# Patient Record
Sex: Male | Born: 1955 | Race: White | Hispanic: No | Smoking: Former smoker
Health system: Southern US, Community
[De-identification: ages and names within clinical notes are randomized; demographics above are authoritative.]

## PROBLEM LIST (undated history)

## (undated) DIAGNOSIS — M545 Low back pain, unspecified: Secondary | ICD-10-CM

## (undated) DIAGNOSIS — E785 Hyperlipidemia, unspecified: Secondary | ICD-10-CM

## (undated) DIAGNOSIS — F32A Depression, unspecified: Secondary | ICD-10-CM

## (undated) DIAGNOSIS — F4310 Post-traumatic stress disorder, unspecified: Secondary | ICD-10-CM

## (undated) DIAGNOSIS — B001 Herpesviral vesicular dermatitis: Secondary | ICD-10-CM

## (undated) DIAGNOSIS — G8929 Other chronic pain: Secondary | ICD-10-CM

## (undated) DIAGNOSIS — K5790 Diverticulosis of intestine, part unspecified, without perforation or abscess without bleeding: Secondary | ICD-10-CM

## (undated) DIAGNOSIS — F329 Major depressive disorder, single episode, unspecified: Secondary | ICD-10-CM

## (undated) DIAGNOSIS — F419 Anxiety disorder, unspecified: Secondary | ICD-10-CM

## (undated) HISTORY — PX: UMBILICAL HERNIA REPAIR: SHX196

## (undated) HISTORY — PX: BACK SURGERY: SHX140

## (undated) HISTORY — PX: LUMBAR SPINE SURGERY: SHX701

## (undated) HISTORY — DX: Anxiety disorder, unspecified: F41.9

## (undated) HISTORY — DX: Depression, unspecified: F32.A

## (undated) HISTORY — DX: Post-traumatic stress disorder, unspecified: F43.10

## (undated) HISTORY — DX: Hyperlipidemia, unspecified: E78.5

## (undated) HISTORY — DX: Herpesviral vesicular dermatitis: B00.1

## (undated) HISTORY — DX: Low back pain, unspecified: M54.50

## (undated) HISTORY — DX: Major depressive disorder, single episode, unspecified: F32.9

## (undated) HISTORY — DX: Diverticulosis of intestine, part unspecified, without perforation or abscess without bleeding: K57.90

## (undated) HISTORY — DX: Low back pain: M54.5

## (undated) HISTORY — DX: Other chronic pain: G89.29

## (undated) HISTORY — PX: COLON RESECTION: SHX5231

---

## 1997-10-27 ENCOUNTER — Ambulatory Visit (HOSPITAL_BASED_OUTPATIENT_CLINIC_OR_DEPARTMENT_OTHER): Admission: RE | Admit: 1997-10-27 | Discharge: 1997-10-27 | Payer: Self-pay | Admitting: Cardiology

## 2001-05-13 ENCOUNTER — Encounter: Payer: Self-pay | Admitting: Pediatrics

## 2001-05-13 ENCOUNTER — Ambulatory Visit (HOSPITAL_COMMUNITY): Admission: RE | Admit: 2001-05-13 | Discharge: 2001-05-13 | Payer: Self-pay | Admitting: Pediatrics

## 2001-11-17 ENCOUNTER — Encounter: Payer: Self-pay | Admitting: Occupational Medicine

## 2001-11-17 ENCOUNTER — Encounter: Admission: RE | Admit: 2001-11-17 | Discharge: 2001-11-17 | Payer: Self-pay | Admitting: Occupational Medicine

## 2002-01-07 ENCOUNTER — Encounter: Payer: Self-pay | Admitting: Emergency Medicine

## 2002-01-07 ENCOUNTER — Inpatient Hospital Stay (HOSPITAL_COMMUNITY): Admission: EM | Admit: 2002-01-07 | Discharge: 2002-01-10 | Payer: Self-pay | Admitting: Emergency Medicine

## 2002-02-17 ENCOUNTER — Ambulatory Visit (HOSPITAL_COMMUNITY): Admission: RE | Admit: 2002-02-17 | Discharge: 2002-02-17 | Payer: Self-pay | Admitting: General Surgery

## 2002-05-07 ENCOUNTER — Inpatient Hospital Stay (HOSPITAL_COMMUNITY): Admission: EM | Admit: 2002-05-07 | Discharge: 2002-05-14 | Payer: Self-pay | Admitting: Internal Medicine

## 2002-05-07 ENCOUNTER — Encounter: Payer: Self-pay | Admitting: Internal Medicine

## 2002-05-08 ENCOUNTER — Encounter: Payer: Self-pay | Admitting: Internal Medicine

## 2003-01-26 ENCOUNTER — Emergency Department (HOSPITAL_COMMUNITY): Admission: EM | Admit: 2003-01-26 | Discharge: 2003-01-27 | Payer: Self-pay | Admitting: Emergency Medicine

## 2003-01-31 ENCOUNTER — Emergency Department (HOSPITAL_COMMUNITY): Admission: EM | Admit: 2003-01-31 | Discharge: 2003-01-31 | Payer: Self-pay | Admitting: Internal Medicine

## 2005-08-24 ENCOUNTER — Emergency Department (HOSPITAL_COMMUNITY): Admission: EM | Admit: 2005-08-24 | Discharge: 2005-08-24 | Payer: Self-pay | Admitting: Emergency Medicine

## 2007-03-22 ENCOUNTER — Emergency Department (HOSPITAL_COMMUNITY): Admission: EM | Admit: 2007-03-22 | Discharge: 2007-03-22 | Payer: Self-pay | Admitting: Family Medicine

## 2009-11-20 ENCOUNTER — Emergency Department (HOSPITAL_COMMUNITY)
Admission: EM | Admit: 2009-11-20 | Discharge: 2009-11-20 | Payer: Self-pay | Source: Home / Self Care | Admitting: Emergency Medicine

## 2009-11-27 ENCOUNTER — Encounter: Admission: RE | Admit: 2009-11-27 | Discharge: 2009-11-27 | Payer: Self-pay | Admitting: Family Medicine

## 2010-05-10 ENCOUNTER — Emergency Department (HOSPITAL_COMMUNITY)
Admission: EM | Admit: 2010-05-10 | Discharge: 2010-05-10 | Payer: Self-pay | Source: Home / Self Care | Admitting: Emergency Medicine

## 2010-05-17 ENCOUNTER — Emergency Department (HOSPITAL_COMMUNITY): Admission: EM | Admit: 2010-05-17 | Discharge: 2010-05-17 | Payer: Self-pay | Admitting: Emergency Medicine

## 2010-05-22 ENCOUNTER — Emergency Department (HOSPITAL_COMMUNITY): Admission: EM | Admit: 2010-05-22 | Discharge: 2010-05-22 | Payer: Self-pay | Admitting: Emergency Medicine

## 2010-05-31 ENCOUNTER — Emergency Department (HOSPITAL_COMMUNITY)
Admission: EM | Admit: 2010-05-31 | Discharge: 2010-05-31 | Payer: Self-pay | Source: Home / Self Care | Admitting: Emergency Medicine

## 2010-06-07 ENCOUNTER — Emergency Department (HOSPITAL_COMMUNITY)
Admission: EM | Admit: 2010-06-07 | Discharge: 2010-06-07 | Payer: Self-pay | Source: Home / Self Care | Admitting: Emergency Medicine

## 2010-06-14 ENCOUNTER — Emergency Department (HOSPITAL_COMMUNITY)
Admission: EM | Admit: 2010-06-14 | Discharge: 2010-06-14 | Payer: Self-pay | Source: Home / Self Care | Admitting: Emergency Medicine

## 2010-07-21 ENCOUNTER — Encounter: Payer: Self-pay | Admitting: Family Medicine

## 2010-08-05 ENCOUNTER — Emergency Department (HOSPITAL_COMMUNITY)
Admission: EM | Admit: 2010-08-05 | Discharge: 2010-08-05 | Disposition: A | Discharge status: Home / Self Care | Payer: Worker's Compensation | Attending: Emergency Medicine | Admitting: Emergency Medicine

## 2010-08-05 DIAGNOSIS — F4310 Post-traumatic stress disorder, unspecified: Secondary | ICD-10-CM | POA: Insufficient documentation

## 2010-08-05 DIAGNOSIS — Z79899 Other long term (current) drug therapy: Secondary | ICD-10-CM | POA: Insufficient documentation

## 2010-08-05 DIAGNOSIS — M549 Dorsalgia, unspecified: Secondary | ICD-10-CM | POA: Insufficient documentation

## 2010-08-05 DIAGNOSIS — F3289 Other specified depressive episodes: Secondary | ICD-10-CM | POA: Insufficient documentation

## 2010-08-05 DIAGNOSIS — F329 Major depressive disorder, single episode, unspecified: Secondary | ICD-10-CM | POA: Insufficient documentation

## 2010-08-05 DIAGNOSIS — G8929 Other chronic pain: Secondary | ICD-10-CM | POA: Insufficient documentation

## 2010-08-05 LAB — DIFFERENTIAL
Basophils Absolute: 0.1 10*3/uL (ref 0.0–0.1)
Basophils Relative: 1 % (ref 0–1)
Eosinophils Absolute: 0.1 10*3/uL (ref 0.0–0.7)
Lymphocytes Relative: 22 % (ref 12–46)
Neutro Abs: 6.2 10*3/uL (ref 1.7–7.7)

## 2010-08-05 LAB — CBC
Hemoglobin: 14.7 g/dL (ref 13.0–17.0)
MCH: 28.1 pg (ref 26.0–34.0)

## 2010-08-05 LAB — BASIC METABOLIC PANEL
Calcium: 9.9 mg/dL (ref 8.4–10.5)
GFR calc Af Amer: >60 mL/min (ref >60)
GFR calc non Af Amer: >60 mL/min (ref >60)
Potassium: 3.9 mEq/L (ref 3.5–5.1)
Sodium: 141 mEq/L (ref 135–145)

## 2010-08-05 LAB — URINALYSIS, ROUTINE W REFLEX MICROSCOPIC
Hgb urine dipstick: NEGATIVE
Nitrite: NEGATIVE
Protein, ur: NEGATIVE mg/dL
Specific Gravity, Urine: >1.03 — ABNORMAL HIGH (ref 1.005–1.030)
Urine Glucose, Fasting: NEGATIVE mg/dL
Urobilinogen, UA: 0.2 mg/dL (ref 0.0–1.0)
pH: 5.5 (ref 5.0–8.0)

## 2010-08-05 LAB — RAPID URINE DRUG SCREEN, HOSP PERFORMED: Cocaine: NOT DETECTED

## 2010-08-05 LAB — ETHANOL: Alcohol, Ethyl (B): <5 mg/dL (ref 0–10)

## 2010-08-08 ENCOUNTER — Emergency Department (HOSPITAL_COMMUNITY)
Admission: EM | Admit: 2010-08-08 | Discharge: 2010-08-08 | Disposition: A | Discharge status: Home / Self Care | Payer: Worker's Compensation | Attending: Emergency Medicine | Admitting: Emergency Medicine

## 2010-08-08 DIAGNOSIS — G8929 Other chronic pain: Secondary | ICD-10-CM | POA: Insufficient documentation

## 2010-08-08 DIAGNOSIS — M549 Dorsalgia, unspecified: Secondary | ICD-10-CM | POA: Insufficient documentation

## 2010-08-08 DIAGNOSIS — E785 Hyperlipidemia, unspecified: Secondary | ICD-10-CM | POA: Insufficient documentation

## 2010-08-08 DIAGNOSIS — Z79899 Other long term (current) drug therapy: Secondary | ICD-10-CM | POA: Insufficient documentation

## 2010-09-09 LAB — BASIC METABOLIC PANEL
BUN: 13 mg/dL (ref 6–23)
CO2: 25 mEq/L (ref 19–32)
Glucose, Bld: 108 mg/dL — ABNORMAL HIGH (ref 70–99)
Potassium: 4.2 mEq/L (ref 3.5–5.1)

## 2010-09-09 LAB — CK: Total CK: 13 U/L (ref 7–232)

## 2010-09-10 LAB — CBC
HCT: 42.6 % (ref 39.0–52.0)
MCH: 27.6 pg (ref 26.0–34.0)
MCV: 82.9 fL (ref 78.0–100.0)
RBC: 5.13 MIL/uL (ref 4.22–5.81)
RDW: 14.3 % (ref 11.5–15.5)
WBC: 12 10*3/uL — ABNORMAL HIGH (ref 4.0–10.5)

## 2010-09-10 LAB — BASIC METABOLIC PANEL
CO2: 25 mEq/L (ref 19–32)
Calcium: 9.2 mg/dL (ref 8.4–10.5)
Chloride: 105 mEq/L (ref 96–112)
Creatinine, Ser: 0.64 mg/dL (ref 0.4–1.5)

## 2010-09-10 LAB — SEDIMENTATION RATE: Sed Rate: 15 mm/hr (ref 0–16)

## 2010-09-10 LAB — DIFFERENTIAL
Basophils Absolute: 0.1 10*3/uL (ref 0.0–0.1)
Basophils Relative: 1 % (ref 0–1)
Eosinophils Relative: 3 % (ref 0–5)
Monocytes Absolute: 0.9 10*3/uL (ref 0.1–1.0)
Monocytes Relative: 8 % (ref 3–12)

## 2010-11-15 NOTE — Discharge Summary (Signed)
[facility]  Patient:    Chad Green, Chad Green Visit Number: [id number] MRN: [id number]          Service Type: MED Location: 3A A331 01 Attending Physician:  Ara Kussmaul Dictated by:   Vivia Ewing, D.O. Admit Date:  01/07/2002 Discharge Date: 01/10/2002                             Discharge Summary  FINAL DISCHARGE DIAGNOSES: 1. Diverticulitis. 2. Diverticular abscess with perforation. 3. Abdominal pain.  IMAGING STUDIES: 1. CT of the abdomen. 2. CT of the pelvis.  BRIEF HISTORY:  The patient presented to the emergency room as a 55 year old man with no previous abdominal history other than a 2-day history of progressively worsening pain. In the emergency room, his workup included a normal CBC, moderate to severe pain, and abdominal tenderness with some rebound tenderness. A CT scan was obtained which confirmed the diagnosis of diverticular perforation and a walled-off abscess for which he was hospitalized for.  HOSPITAL COURSE:  The patient was admitted and placed on IV antibiotics consisting of Unasyn. He was also given some IV fluids. He had a requirement for parenteral pain medications and received a PCA pump while he was in the hospital which relieved his pain quite nicely. He had improvement in his tenderness of his abdomen and his exam improved each day while in the hospital. He became much less rigid and actually very soft but with some minimal tenderness in the suprapubic area.  Dr. Lovell Sheehan, our local general surgeon, was consulted and followed the patient as well for any potential surgical intervention that might have been required urgently. He had advised the patient to follow up with him in two weeks at which time he will receive a repeat CT scan and review with Dr. Lovell Sheehan the possible need for long-term repair of his diverticulitis.  The patient was also seen by a nutritionist who advised on proper diet for  his diverticulitis.  DISCHARGE MEDICATIONS: 1. Cipro 500 mg p.o. b.i.d. for 10-14 days. 2. Hydrocodone one or two 5 mg pills with Tylenol q.4 h. p.r.n. pain.  He is to contact me or Dr. Lovell Sheehan for any further complaints. Dictated by: Vivia Ewing, D.O. Attending Physician:  Ara Kussmaul DD:  01/17/02 TD:  01/22/02 Job: [id number]

## 2010-11-15 NOTE — H&P (Signed)
   Chad Green, Chad Green NO.:  [id number]   MEDICAL RECORD NO.:  [id number]                   PATIENT TYPE:   LOCATION:                                       FACILITY:  [facility]   PHYSICIAN:  Dalia Heading, M.D.               DATE OF BIRTH:  July 05, 1955   DATE OF ADMISSION:  05/07/2002  DATE OF DISCHARGE:                                HISTORY & PHYSICAL   CHIEF COMPLAINT:  Recurrent sigmoid diverticulitis.   HISTORY OF PRESENT ILLNESS:  The patient is a 55 year old white male who  presents with a recurrent episode of diverticulitis.  This was treated with  Ciprofloxacin without difficulty.  He had a previous episode of this in July  2003.  A colonoscopy revealed a sigmoid diverticula.  He did have a small  abscess that was contained in the sigmoid colon region on CAT scan.  Due to  the return nature of his diverticulitis, he now presents for a partial  colostomy.   PAST MEDICAL HISTORY:  Allergic rhinitis.   PAST SURGICAL HISTORY:  Unremarkable.  Four umbilical herniorrhaphies.   CURRENT MEDICATIONS:  1. Orudis  2. Zoloft.  3. Multivitamin.  4. Calcium supplements.   ALLERGIES:  None.   REVIEW OF SYSTEMS:  Noncontributory.   PHYSICAL EXAMINATION:  GENERAL:  The patient is a well-developed, well-  nourished white male in no acute distress.  He is afebrile.  VITAL SIGNS:  Stable.  LUNGS:  Clear to auscultation with equal breath sounds bilaterally.  HEART:  Reveals regular rate and rhythm without S3, S4, murmurs.  ABDOMEN:  Soft, nontender, nondistended, no palpable splenomegaly, masses,  rigidity are noted.   IMPRESSION:  Recurrent sigmoid diverticulitis.    PLAN:  The patient is scheduled to undergo a partial colectomy on May 09, 2002.  The risks and benefits of the procedure, including bleeding,  infection, and the remote possibility of a colostomy were fully explained to  the patient; he gave informed consent.  A bowel preparation,  including  CoLyte, erythromycin, and neomycin base was prescribed for the day before  the procedure.                                                   Dalia Heading, M.D.    MAJ/MEDQ  D:  05/03/2002  T:  05/03/2002  Job:  [id number]   cc:   Francoise Schaumann. Milford Cage, D.O.  [address]  [place]  [place] [address]  Fax: [phone]   Dalia Heading, M.D.  [address] [place]  [place] [address]  Fax: [phone]

## 2010-11-15 NOTE — H&P (Signed)
[facility]  Patient:    Chad Green, OMLOR Visit Number: [id number] MRN: [id number]          Service Type: MED Location: 3A A331 01 Attending Physician:  Ara Kussmaul Dictated by:   Vivia Ewing, D.O. Admit Date:  01/07/2002 Discharge Date: 01/10/2002   CC:         Franky Macho, M.D.   History and Physical  CHIEF COMPLAINT:  Belly pain.  BRIEF HISTORY:  The patient is a 55 year old gentleman who presents with a one to two days history of progressively worsening lower abdominal and pelvic pain.  The patient was traveling from the [place] and woke up yesterday in the morning having some mild aching discomfort in his abdomen.  He proceeded to work most of the day, and later that night, flew back to the [place] area.  His symptoms progressively got worse and he woke up this morning with severe pain.  He has had no significant nausea, loss of appetite, diarrhea, vomiting, abnormal bowel movements.  His most recent bowel movement was yesterday morning and it was apparently normal.  In the emergency room, the patient was noted to have a significantly tender abdomen.  A CAT scan was obtained which shows evidence of diverticular disease with abscess versus perforation.  PAST MEDICAL HISTORY:  Simple allergic rhinitis and occasional sinusitis.  He has had no significant past medical history.  He has had surgery on an umbilical hernia approximately six years ago as well as surgery on two herniated disk in his back.  He has had no problems with anesthesia in the past.  MEDICATIONS:  Orudis over-the-counter, Zoloft 50 mg q.d., multivitamins, calcium supplement, and a "fat burner."  ALLERGIES:  No known drug allergies.  SOCIAL HISTORY:  The patient is employed and married with one child.  He has occasional alcohol, but no evidence of abuse.  He has no history of smoking or illegal drug use.  FAMILY HISTORY:  Noncontributory.  REVIEW OF SYSTEMS:   The patient denies any symptoms of fever, URI, cough, congestion, dyspnea, dysuria, or skin rashes  He has abdominal pain, mainly in the lower abdomen, rating it 8 out of 10.  PHYSICAL EXAMINATION:  GENERAL:  The patient is in mild to moderate distress.  Has received morphine prior to my examination.  HEAD AND NECK:  Exam is unremarkable.  His thyroid is normal to palpation.  HEART:  Regular with no murmur.  LUNGS:  Clear.  ABDOMEN:  Relatively firm with guarding.  He has hyperactive bowel sounds. There is significant tenderness throughout the abdomen, particularly in the lower quadrants.  He does have evidence of significant rebound tenderness.  GU:  Testicular exam is unremarkable.  SKIN:  Shows no significant rash.  EXTREMITIES:  Show no edema.  LABORATORY STUDIES:  CBC is unremarkable.  Met-7 is unremarkable.  Urinalysis is normal.  A CT scan report shows sigmoid diverticulitis with a 1.4 cm contained perforation or abscess.  There is a small amount of free fluid noted in the pelvis.  IMPRESSION: 1. Diverticulitis, new onset with suspected perforation versus abscess. 2. Otherwise healthy 55 year old man with no significant past medical history.  PLAN:  Will be to have Dr. Lovell Sheehan evaluate him as a surgical consultation.  I have contacted him, and once he is done in the OR, he will evaluate the patient for possible surgical intervention.  The patient has received one dose of 3 g of IV Unasyn and we will continue this q.6.  He is on normal saline and we will continue IV support pending his surgical evaluation.  I have discussed the overall care plan and the patients condition with him, and he is in agreement with our plan. Dictated by:   Vivia Ewing, D.O. Attending Physician:  Ara Kussmaul DD:  01/07/02 TD:  01/10/02 Job: [id number]

## 2010-11-15 NOTE — H&P (Signed)
NAME:  LYMON, KIDNEY                          ACCOUNT NO.:  [id number]   MEDICAL RECORD NO.:  [id number]                   PATIENT TYPE:  INP   LOCATION:  A331                                 FACILITY:  [facility]   PHYSICIAN:  Dalia Heading, M.D.               DATE OF BIRTH:  1956-02-11   DATE OF ADMISSION:  01/25/2002  DATE OF DISCHARGE:                                HISTORY & PHYSICAL   CHIEF COMPLAINT:  Sigmoid diverticulitis with contained abscess.   HISTORY OF PRESENT ILLNESS:  The patient is a 55 year old white male who was  treated in July of 2003 for diverticulitis.  A CT scan revealed evidence of  a contained perforation into the mesentery of the sigmoid colon.  He has  since improved with antibiotic therapy and is asymptomatic at this point.  He now presents for a colonoscopy for follow up of his diverticulitis and  perforation.   PAST MEDICAL HISTORY:  Depression.   PAST SURGICAL HISTORY:  Umbilical herniorrhaphies.   CURRENT MEDICATIONS:  Orudis, Zoloft, calcium supplements, multivitamin.   ALLERGIES:  No known drug allergies.   REVIEW OF SYSTEMS:  Noncontributory.   PHYSICAL EXAMINATION:   GENERAL:  The patient is a well-developed and well-nourished white male in  no acute distress.   VITAL SIGNS:  He is afebrile and vital signs are stable.   LUNGS:  Lungs are clear to auscultation with equal breath sounds  bilaterally.   CARDIAC:  Heart examination reveals a regular rate and rhythm without S3,  S4, or murmurs.   ABDOMEN:  The abdomen is soft, nontender, and nondistended.  No  hepatosplenomegaly or masses are noted.   RECTAL:  Rectal examination was deferred to the procedure.   IMPRESSION:  Sigmoid diverticulitis, resolved.    PLAN:  The patient is scheduled for a colonoscopy on February 17, 2002.  The  risks and benefits of the procedure including bleeding and perforation were  fully explained to the patient, who gave informed consent.                                       Dalia Heading, M.D.    MAJ/MEDQ  D:  01/25/2002  T:  01/26/2002  Job:  [id number]

## 2010-11-15 NOTE — Consult Note (Signed)
[facility]  Patient:    Chad Green, Chad Green Visit Number: [id number] MRN: [id number]          Service Type: MED Location: 3A A331 01 Attending Physician:  Ara Kussmaul Dictated by:   Franky Macho, M.D. Proc. Date: 01/07/02 Admit Date:  01/07/2002 Discharge Date: 01/10/2002   CC:         Debbora Dus, M.D.   Consultation Report   REASON FOR CONSULTATION:  Diverticular abscess.  REFERRING PHYSICIAN:  Dr. Debbora Dus.  HISTORY OF PRESENT ILLNESS:  The patient is a 55 year old white male who presents with a 24-hour history of worsening lower abdominal pain.  This is the first time he has ever had an episode like this.  He presented to the emergency room for further evaluation and treatment.  A CT scan of the abdomen and pelvis was performed which revealed a sigmoid colon diverticular abscess which is contained in the mesentery. Minimal free fluid is noted.  There is no evidence of pneumoperitoneum.  The patient is being admitted by Dr. Stephania Fragmin for further management of his diverticular abscess.  He has never had an episode of abdominal pain like this before.  He denies any constipation, diarrhea, melena, or hematochezia.  PAST MEDICAL HISTORY:  Includes depression.  PAST SURGICAL HISTORY:  Umbilical herniorrhaphies.  CURRENT MEDICATIONS: 1. Arudis. 2. Zoloft. 3. Multivitamin. 4. Calcium supplements.  ALLERGIES:  No known drug allergies.  PHYSICAL EXAMINATION:  GENERAL:  The patient is a well-developed well-nourished white male in moderate discomfort when he moves.  VITAL SIGNS:   He is afebrile and vital signs are stable.  ABDOMEN: Soft with voluntary guarding along the lower half of the abdomen.  No significant distention is noted.  Minimal rigidity is noted.  No masses or hernias are noted.  RECTAL:  Deferred at this time.  LABORATORY DATA:  Chem-12 is within normal limits.  Urinalysis is negative. White blood cell count 10.3, hematocrit  41.0, platelet count 251.  IMPRESSION:  Sigmoid diverticulitis with abscess formation contained.  PLAN:  Agree with admission to the hospital for a full liquid diet and pain control as well as IV Unasyn.  FOLLOWUP: I will follow  the patient closely with you.  This was discussed with Dr. Debbora Dus who agrees. Dictated by:   Franky Macho, M.D. Attending Physician:  Ara Kussmaul DD:  01/07/02 TD:  01/10/02 Job: [id number]

## 2010-11-15 NOTE — Op Note (Signed)
NAME:  Chad Green, Chad Green                          ACCOUNT NO.:  [id number]   MEDICAL RECORD NO.:  [id number]                   PATIENT TYPE:  INP   LOCATION:  A323                                 FACILITY:  [facility]   PHYSICIAN:  Dalia Heading, M.D.               DATE OF BIRTH:  February 08, 1956   DATE OF PROCEDURE:  05/09/2002  DATE OF DISCHARGE:                                 OPERATIVE REPORT   PREOPERATIVE DIAGNOSIS:  Recurrent sigmoid diverticulitis.   POSTOPERATIVE DIAGNOSIS:  Recurrent sigmoid diverticulitis.   OPERATION PERFORMED:  Sigmoid colectomy.   SURGEON:  Dalia Heading, M.D.   ASSISTANT:  ________ Precious Bard, M.D.   ANESTHESIA:  General endotracheal.   INDICATIONS FOR PROCEDURE:  The patient is a 55 year old white male who  presents with recurrent episodes sigmoid diverticulitis.  The risks and  benefits of the procedure including bleeding, infection, and recurrence of  the diverticulitis were fully explained to the patient, he gave informed  consent.   DESCRIPTION OF PROCEDURE:  The patient was placed in supine position.  After  induction of general endotracheal anesthesia, the abdomen was prepped and  draped using the usual sterile technique with Betadine.  An infraumbilical  incision was made in the midline.  This was taken down from the umbilicus to  the suprapubic region.  The peritoneal cavity was entered into without  difficulty.  The small bowel was inspected and noted to have somewhat  adhesive disease in the midportion of it.  This was not obstructive in  nature.  The nasogastric tube was noted to be in appropriate position.  The  sigmoid colon was mobilized along the line of Toldt.  This was taken down to  the distal sigmoid colon.  This is where the inflammatory process was noted  to be found.  The segment was approximately 10 to 12 cm in its greatest  length.  A GIA stapler was placed across the distal sigmoid colon and  another GIA was placed across the  proximal sigmoid colon and fired.  The  mesentery was divided and ligated with 2-0 ties.  The segment of inflamed  sigmoid colon was removed.  All apparent areas of diverticular disease were  removed.  A side-to-side colon to colon anastomosis was then performed using  a GIA stapler.  The colotomy was closed using a TA-60 stapler.  The staple  line was bolstered using 3-0 silk Lembert sutures.  No leak was noted.  The  anastomosis was noted to be widely patent.  The pelvis was then copiously  irrigated with gentamicin and normal saline.  A #10 flat Jackson-Pratt drain  was placed into the left pericolic gutter and pelvis and secured in place at  the skin level using a 3-0 nylon suture.  All operating room personnel then  changed their gloves. The bowel was then returned into the abdominal cavity  in orderly fashion.  The fascia was reapproximated using a looped running  Novofil suture.  The subcutaneous layer was irrigated with normal saline and  the skin was closed using staples.  Betadine ointment and a dry sterile  dressing were applied.  All tape and needle counts correct at the end of the  procedure.  The patient was extubated in the operating room and went back to  the recovery room in stable condition.   COMPLICATIONS:  None.   SPECIMENS:  Sigmoid colon.   ESTIMATED BLOOD LOSS:  Less than 100 cc.   DRAINS:  Jackson-Pratt drain to left pelvis.                                               Dalia Heading, M.D.    MAJ/MEDQ  D:  05/09/2002  T:  05/09/2002  Job:  [id number]   cc:   Rosalio Macadamia  [address]  [place]  [place] [address]  Fax: [phone]

## 2010-11-15 NOTE — Discharge Summary (Signed)
   NAME:  Chad Green, Chad Green                          ACCOUNT NO.:  [id number]   MEDICAL RECORD NO.:  [id number]                   PATIENT TYPE:  INP   LOCATION:  A323                                 FACILITY:  [facility]   PHYSICIAN:  Dalia Heading, M.D.               DATE OF BIRTH:  05-Oct-1955   DATE OF ADMISSION:  05/07/2002  DATE OF DISCHARGE:  05/14/2002                                 DISCHARGE SUMMARY   HOSPITAL COURSE SUMMARY:  The patient is a 55 year old white male who was  admitted on May 07, 2002 with an episode of recurrent sigmoid  diverticulitis.  He had significant pain and fevers on the day of admission  and was brought to the hospital for intravenous antibiotics and pain  control.  A CT scan of the abdomen and pelvis was performed which revealed  no free perforation or abscess.  Sigmoid colon inflammation was noted.  He  subsequently went to the operating room on May 09, 2002 and underwent a  sigmoid colectomy.  He tolerated the procedure well.  His postoperative  course has been for the most part unremarkable.  His diet was advanced  without difficulty once his bowel function returned.  He is being discharged  home on May 14, 2002 in good and improving condition.   DISCHARGE INSTRUCTIONS:  The patient is to follow up with Dr. Franky Macho  on May 17, 2002.   DISCHARGE MEDICATIONS:  Vicodin one to two tablets p.o. q.4h. p.r.n. pain.   PRINCIPAL DIAGNOSIS:  Sigmoid diverticulitis.   PRINCIPAL PROCEDURE:  Sigmoid colectomy on May 09, 2002.                                                Dalia Heading, M.D.    MAJ/MEDQ  D:  05/14/2002  T:  05/14/2002  Job:  [id number]   cc:   Francoise Schaumann. Halm, D.O.  [address]  [place]  [place] [address]  Fax: [phone]

## 2011-12-20 ENCOUNTER — Ambulatory Visit: Payer: Self-pay | Admitting: Family Medicine

## 2011-12-20 VITALS — BP 128/84 | HR 66 | Temp 98.3°F | Resp 16 | Ht 71.0 in | Wt 264.0 lb

## 2011-12-20 DIAGNOSIS — N529 Male erectile dysfunction, unspecified: Secondary | ICD-10-CM

## 2011-12-20 DIAGNOSIS — Z0289 Encounter for other administrative examinations: Secondary | ICD-10-CM

## 2011-12-20 MED ORDER — TADALAFIL 20 MG PO TABS: 10.0000 mg | ORAL_TABLET | ORAL | Status: DC | PRN

## 2011-12-20 NOTE — Progress Notes (Signed)
  Subjective:    Patient ID: Chad Green, male    DOB: May 04, 1956, 56 y.o.   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   Has optometrist. Had contacts prescribed 1 month ago. but not needing to wear regularly.  - last rx from [facility].  Prior on zoloft and xanax for PTSD.  Last taken months ago.  Does not take xanax if driving.    ED - prior on Cialis by Dr. Doristine Counter in [place]  Will be coming here for primary care.   No hx of heart problems. No prior difficulty with cialis.  No ha/lightheadedness, dizziness.    Review of Systems     Objective:   Physical Exam  Constitutional: He is oriented to person, place, and time. He appears well-developed and well-nourished.  HENT:  Head: Normocephalic and atraumatic.  Right Ear: External ear normal.  Left Ear: External ear normal.  Eyes: EOM are normal. Pupils are equal, round, and reactive to light.  Neck: No JVD present. Carotid bruit is not present.  Cardiovascular: Normal rate, regular rhythm and normal heart sounds.   No murmur heard. Pulmonary/Chest: Effort normal and breath sounds normal. He has no rales.  Abdominal: Soft. There is no tenderness. Hernia confirmed negative in the right inguinal area and confirmed negative in the left inguinal area.  Musculoskeletal: He exhibits no edema.  Neurological: He is alert and oriented to person, place, and time.  Skin: Skin is warm and dry.  Psychiatric: He has a normal mood and affect. His behavior is normal.          Assessment & Plan:  Chad Green is a 56 y.o. 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 Paperwork completed, but to pick up, will need to rtc with corrective lenses in to recheck vision.   Trace proteinuria - likely volume depletion - recheck in next 6-8 weeks when hydrated.  ED - by hx.,  Rx cialis 10mg  q36hprn - #5 only -  needs ov for refills or f/u at other provider. SED.

## 2011-12-21 ENCOUNTER — Ambulatory Visit (INDEPENDENT_AMBULATORY_CARE_PROVIDER_SITE_OTHER): Payer: Self-pay | Admitting: Physician Assistant

## 2011-12-21 DIAGNOSIS — Z719 Counseling, unspecified: Secondary | ICD-10-CM

## 2011-12-21 DIAGNOSIS — Z7189 Other specified counseling: Secondary | ICD-10-CM

## 2011-12-21 NOTE — Progress Notes (Signed)
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  He passes today with corrective lenses.

## 2013-03-14 ENCOUNTER — Encounter: Payer: Self-pay | Admitting: Family Medicine

## 2013-03-14 ENCOUNTER — Ambulatory Visit (INDEPENDENT_AMBULATORY_CARE_PROVIDER_SITE_OTHER): Payer: Medicare Other | Admitting: Family Medicine

## 2013-03-14 VITALS — BP 157/93 | HR 72 | Temp 98.8°F | Resp 18 | Ht 72.0 in | Wt 257.0 lb

## 2013-03-14 DIAGNOSIS — Z7189 Other specified counseling: Secondary | ICD-10-CM

## 2013-03-14 DIAGNOSIS — M545 Low back pain, unspecified: Secondary | ICD-10-CM

## 2013-03-14 DIAGNOSIS — F4310 Post-traumatic stress disorder, unspecified: Secondary | ICD-10-CM

## 2013-03-14 DIAGNOSIS — Z7689 Persons encountering health services in other specified circumstances: Secondary | ICD-10-CM

## 2013-03-14 DIAGNOSIS — G8929 Other chronic pain: Secondary | ICD-10-CM

## 2013-03-14 NOTE — Assessment & Plan Note (Signed)
No counseling at this time. He feels like he deals with this best by clinging to his faith. He tries to take sertraline regularly, only takes alprazolam occasionally.

## 2013-03-14 NOTE — Assessment & Plan Note (Signed)
Will try to get old records from pain clinic ([facility]). He says he currently takes nothing for this, but then revealed that he "self medicates" sometimes with someone else's rx med. When asked what med he said he would rather not say. I did not rx any meds for him today and he did not ask for any.   I told him to make appt in the next 1-2 wks for further discussion of his chronic pain and we'll see if we can come up with an approach to treatment. This will, however, require full disclosure by the pt (he'll have to clarify his statement about self medicating).

## 2013-03-14 NOTE — Assessment & Plan Note (Signed)
Says old primary MD is retiring. Reviewed history. Will obtain primary care old records and pain clinic records.
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
## 2013-03-23 ENCOUNTER — Ambulatory Visit (INDEPENDENT_AMBULATORY_CARE_PROVIDER_SITE_OTHER): Payer: Medicare Other | Admitting: Family Medicine

## 2013-03-23 ENCOUNTER — Encounter: Payer: Self-pay | Admitting: Family Medicine

## 2013-03-23 VITALS — BP 156/90 | HR 66 | Temp 98.8°F | Resp 18 | Ht 72.0 in | Wt 260.0 lb

## 2013-03-23 DIAGNOSIS — I10 Essential (primary) hypertension: Secondary | ICD-10-CM | POA: Insufficient documentation

## 2013-03-23 DIAGNOSIS — M545 Low back pain, unspecified: Secondary | ICD-10-CM

## 2013-03-23 DIAGNOSIS — G8929 Other chronic pain: Secondary | ICD-10-CM

## 2013-03-23 DIAGNOSIS — N529 Male erectile dysfunction, unspecified: Secondary | ICD-10-CM | POA: Insufficient documentation

## 2013-03-23 LAB — BASIC METABOLIC PANEL
BUN: 12 mg/dL (ref 6–23)
CO2: 27 mEq/L (ref 19–32)
Calcium: 9.6 mg/dL (ref 8.4–10.5)
GFR: 115.86 mL/min (ref >60.00)
Glucose, Bld: 89 mg/dL (ref 70–99)
Potassium: 4.1 mEq/L (ref 3.5–5.1)

## 2013-03-23 MED ORDER — TADALAFIL 20 MG PO TABS: ORAL_TABLET | ORAL | Status: DC

## 2013-03-23 MED ORDER — BENAZEPRIL HCL 20 MG PO TABS: 20.0000 mg | ORAL_TABLET | Freq: Every day | ORAL | Status: DC
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
## 2013-04-03 ENCOUNTER — Encounter: Payer: Self-pay | Admitting: Family Medicine

## 2013-04-03 NOTE — Assessment & Plan Note (Signed)
No change/additional meds rx'd today. I won't be managing his chronic pain. He is content managing it his current way: deal with it until unbearable and then take a "friend's" rx pain pill prn.

## 2013-04-03 NOTE — Assessment & Plan Note (Addendum)
Restart benazepril (sounds like he may have never started this med ? ?). Benazepril 20mg  qd.  Therapeutic expectations and side effect profile of medication discussed today.  Patient's questions answered. BMET today.

## 2013-04-03 NOTE — Assessment & Plan Note (Signed)
Cialis 20mg  samples given today: 1/2-1 tab po qd prn.  Therapeutic expectations and side effect profile of medication discussed today.  Patient's questions answered.

## 2013-04-22 ENCOUNTER — Ambulatory Visit: Payer: Medicare Other | Admitting: Family Medicine

## 2013-04-22 DIAGNOSIS — Z0289 Encounter for other administrative examinations: Secondary | ICD-10-CM

## 2013-08-01 ENCOUNTER — Telehealth: Payer: Self-pay | Admitting: Family Medicine

## 2013-08-01 MED ORDER — VALACYCLOVIR HCL 1 G PO TABS: ORAL_TABLET | ORAL | Status: DC

## 2013-08-01 NOTE — Telephone Encounter (Signed)
Patient called stating he wanted his valtrex rx renewed.   Patient states that he takes it for fever blister as needed.   Patient states he hasn't used it in about a year but recently pt has been getting fever blisters.  Please advise.

## 2013-08-01 NOTE — Telephone Encounter (Signed)
Patient aware.

## 2013-08-01 NOTE — Telephone Encounter (Signed)
Valtrex rx sent

## 2013-09-28 HISTORY — DX: Rider (driver) (passenger) of other motorcycle injured in unspecified traffic accident, initial encounter: V29.99XA

## 2013-10-09 ENCOUNTER — Emergency Department (HOSPITAL_COMMUNITY): Payer: No Typology Code available for payment source

## 2013-10-09 ENCOUNTER — Emergency Department (HOSPITAL_COMMUNITY)
Admission: EM | Admit: 2013-10-09 | Discharge: 2013-10-10 | Disposition: A | Discharge status: Home / Self Care | Payer: No Typology Code available for payment source | Attending: Emergency Medicine | Admitting: Emergency Medicine

## 2013-10-09 ENCOUNTER — Encounter (HOSPITAL_COMMUNITY): Payer: Self-pay | Admitting: Emergency Medicine

## 2013-10-09 DIAGNOSIS — M25559 Pain in unspecified hip: Secondary | ICD-10-CM | POA: Diagnosis not present

## 2013-10-09 DIAGNOSIS — IMO0002 Reserved for concepts with insufficient information to code with codable children: Secondary | ICD-10-CM | POA: Diagnosis not present

## 2013-10-09 DIAGNOSIS — S139XXA Sprain of joints and ligaments of unspecified parts of neck, initial encounter: Secondary | ICD-10-CM | POA: Diagnosis not present

## 2013-10-09 DIAGNOSIS — M25519 Pain in unspecified shoulder: Secondary | ICD-10-CM | POA: Insufficient documentation

## 2013-10-09 DIAGNOSIS — M545 Low back pain, unspecified: Secondary | ICD-10-CM | POA: Diagnosis not present

## 2013-10-09 DIAGNOSIS — G8929 Other chronic pain: Secondary | ICD-10-CM | POA: Insufficient documentation

## 2013-10-09 DIAGNOSIS — R51 Headache: Secondary | ICD-10-CM | POA: Diagnosis not present

## 2013-10-09 DIAGNOSIS — E785 Hyperlipidemia, unspecified: Secondary | ICD-10-CM | POA: Insufficient documentation

## 2013-10-09 DIAGNOSIS — F4310 Post-traumatic stress disorder, unspecified: Secondary | ICD-10-CM | POA: Diagnosis not present

## 2013-10-09 DIAGNOSIS — S025XXA Fracture of tooth (traumatic), initial encounter for closed fracture: Secondary | ICD-10-CM | POA: Insufficient documentation

## 2013-10-09 DIAGNOSIS — Y9389 Activity, other specified: Secondary | ICD-10-CM | POA: Insufficient documentation

## 2013-10-09 DIAGNOSIS — R0789 Other chest pain: Secondary | ICD-10-CM | POA: Diagnosis not present

## 2013-10-09 DIAGNOSIS — F341 Dysthymic disorder: Secondary | ICD-10-CM | POA: Diagnosis not present

## 2013-10-09 DIAGNOSIS — S39012A Strain of muscle, fascia and tendon of lower back, initial encounter: Secondary | ICD-10-CM

## 2013-10-09 DIAGNOSIS — Z87891 Personal history of nicotine dependence: Secondary | ICD-10-CM | POA: Diagnosis not present

## 2013-10-09 DIAGNOSIS — Y92410 Unspecified street and highway as the place of occurrence of the external cause: Secondary | ICD-10-CM | POA: Diagnosis not present

## 2013-10-09 DIAGNOSIS — S161XXA Strain of muscle, fascia and tendon at neck level, initial encounter: Secondary | ICD-10-CM

## 2013-10-09 DIAGNOSIS — S335XXA Sprain of ligaments of lumbar spine, initial encounter: Secondary | ICD-10-CM | POA: Insufficient documentation

## 2013-10-09 DIAGNOSIS — M25552 Pain in left hip: Secondary | ICD-10-CM

## 2013-10-09 LAB — CBC
HEMATOCRIT: 46.5 % (ref 39.0–52.0)
Hemoglobin: 15.8 g/dL (ref 13.0–17.0)
MCH: 29.4 pg (ref 26.0–34.0)
MCHC: 34 g/dL (ref 30.0–36.0)
MCV: 86.6 fL (ref 78.0–100.0)
Platelets: 217 10*3/uL (ref 150–400)
RBC: 5.37 MIL/uL (ref 4.22–5.81)
RDW: 13.6 % (ref 11.5–15.5)
WBC: 8.4 10*3/uL (ref 4.0–10.5)

## 2013-10-09 LAB — BASIC METABOLIC PANEL
BUN: 21 mg/dL (ref 6–23)
CO2: 22 mEq/L (ref 19–32)
CREATININE: 0.78 mg/dL (ref 0.50–1.35)
Calcium: 9.6 mg/dL (ref 8.4–10.5)
Chloride: 104 mEq/L (ref 96–112)
GFR calc non Af Amer: >90 mL/min (ref >90)
Glucose, Bld: 90 mg/dL (ref 70–99)
POTASSIUM: 4.1 meq/L (ref 3.7–5.3)
Sodium: 140 mEq/L (ref 137–147)

## 2013-10-09 LAB — TROPONIN I: Troponin I: <0.3 ng/mL (ref <0.30)

## 2013-10-09 MED ORDER — KETOROLAC TROMETHAMINE 60 MG/2ML IM SOLN
60.0000 mg | Freq: Once | INTRAMUSCULAR | Status: AC
  Administered 2013-10-09: 60 mg via INTRAMUSCULAR
  Filled 2013-10-09: qty 2

## 2013-10-09 MED ORDER — OXYCODONE-ACETAMINOPHEN 5-325 MG PO TABS: 1.0000 | ORAL_TABLET | Freq: Three times a day (TID) | ORAL | Status: DC | PRN

## 2013-10-09 MED ORDER — OXYCODONE-ACETAMINOPHEN 5-325 MG PO TABS
1.0000 | ORAL_TABLET | Freq: Once | ORAL | Status: AC
  Administered 2013-10-09: 1 via ORAL
  Filled 2013-10-09: qty 1

## 2013-10-09 MED ORDER — DIAZEPAM 5 MG/ML IJ SOLN
5.0000 mg | Freq: Once | INTRAMUSCULAR | Status: DC
  Administered 2013-10-09: 5 mg via INTRAMUSCULAR
  Filled 2013-10-09: qty 2
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
## 2013-10-09 NOTE — ED Provider Notes (Signed)
Date: 10/09/2013  Rate: 60  Rhythm: normal sinus rhythm  QRS Axis: normal  Intervals: normal  ST/T Wave abnormalities: nonspecific ST changes  Conduction Disutrbances:none  Narrative Interpretation:   Old EKG Reviewed: none available    [name], MD 10/09/13 [id number]
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
## 2013-10-09 NOTE — ED Notes (Signed)
Pt ambulated to and from bathroom with slight limp

## 2013-10-09 NOTE — Discharge Instructions (Signed)
Please call your doctor for a followup appointment within 24-48 hours. When you talk to your doctor please let them know that you were seen in the emergency department and have them acquire all of your records so that they can discuss the findings with you and formulate a treatment plan to fully care for your new and ongoing problems. Please call and set-up an appointment with orthopedics Please rest and stay hydrated Please avoid any physical or strenuous activity Please take medications as prescribed. While on pain medications there is to be no drinking alcohol or driving, no operating any heavy machinery - if there is extra please dispose in a proper manner. Please do not take any extra tylenol with this medication for this can lead to liver issues and tylenol overdose that can be fatal Please massage and stretch Please continue to monitor symptoms closely and if symptoms are to worsen or change (fever greater than 101, chills, sweating, nausea, vomiting, diarrhea, abdominal pain, chest pain, shortness of breath, difficulty breathing, numbness, tingling, loss of sensation, fall, injury, inability to control urine and bowel movements, dizziness, headaches, blurred vision, sudden loss of vision) please report back to the ED immediately   Arthralgia Arthralgia is joint pain. A joint is a place where two bones meet. Joint pain can happen for many reasons. The joint can be bruised, stiff, infected, or weak from aging. Pain usually goes away after resting and taking medicine for soreness.  HOME CARE  Rest the joint as told by your doctor.  Keep the sore joint raised (elevated) for the first 24 hours.  Put ice on the joint area.  Put ice in a plastic bag.  Place a towel between your skin and the bag.  Leave the ice on for 15-20 minutes, 03-04 times a day.  Wear your splint, casting, elastic bandage, or sling as told by your doctor.  Only take medicine as told by your doctor. Do not take  aspirin.  Use crutches as told by your doctor. Do not put weight on the joint until told to by your doctor. GET HELP RIGHT AWAY IF:   You have bruising, puffiness (swelling), or more pain.  Your fingers or toes turn blue or start to lose feeling (numb).  Your medicine does not lessen the pain.  Your pain becomes severe.  You have a temperature by mouth above 102 F (38.9 C), not controlled by medicine.  You cannot move or use the joint. MAKE SURE YOU:   Understand these instructions.  Will watch your condition.  Will get help right away if you are not doing well or get worse. Document Released: 06/04/2009 Document Revised: 09/08/2011 Document Reviewed: 06/04/2009 [facility] Patient Information 2014 [place], [place]. Back Exercises Back exercises help treat and prevent back injuries. The goal is to increase your strength in your belly (abdominal) and back muscles. These exercises can also help with flexibility. Start these exercises when told by your doctor. HOME CARE Back exercises include: Pelvic Tilt.  Lie on your back with your knees bent. Tilt your pelvis until the lower part of your back is against the floor. Hold this position 5 to 10 sec. Repeat this exercise 5 to 10 times. Knee to Chest.  Pull 1 knee up against your chest and hold for 20 to 30 seconds. Repeat this with the other knee. This may be done with the other leg straight or bent, whichever feels better. Then, pull both knees up against your chest. Sit-Ups or Curl-Ups.  Bend your  knees 90 degrees. Start with tilting your pelvis, and do a partial, slow sit-up. Only lift your upper half 30 to 45 degrees off the floor. Take at least 2 to 3 seonds for each sit-up. Do not do sit-ups with your knees out straight. If partial sit-ups are difficult, simply do the above but with only tightening your belly (abdominal) muscles and holding it as told. Hip-Lift.  Lie on your back with your knees flexed 90 degrees. Push down  with your feet and shoulders as you raise your hips 2 inches off the floor. Hold for 10 seconds, repeat 5 to 10 times. Back Arches.  Lie on your stomach. Prop yourself up on bent elbows. Slowly press on your hands, causing an arch in your low back. Repeat 3 to 5 times. Shoulder-Lifts.  Lie face down with arms beside your body. Keep hips and belly pressed to floor as you slowly lift your head and shoulders off the floor. Do not overdo your exercises. Be careful in the beginning. Exercises may cause you some mild back discomfort. If the pain lasts for more than 15 minutes, stop the exercises until you see your doctor. Improvement with exercise for back problems is slow.  Document Released: 07/19/2010 Document Revised: 09/08/2011 Document Reviewed: 04/17/2011 [facility] Patient Information 2014 [place], [place]. Cervical Strain and Sprain (Whiplash) with Rehab Cervical strain and sprains are injuries that commonly occur with "whiplash" injuries. Whiplash occurs when the neck is forcefully whipped backward or forward, such as during a motor vehicle accident. The muscles, ligaments, tendons, discs and nerves of the neck are susceptible to injury when this occurs. SYMPTOMS   Pain or stiffness in the front and/or back of neck  Symptoms may present immediately or up to 24 hours after injury.  Dizziness, headache, nausea and vomiting.  Muscle spasm with soreness and stiffness in the neck.  Tenderness and swelling at the injury site. CAUSES  Whiplash injuries often occur during contact sports or motor vehicle accidents.  RISK INCREASES WITH:  Osteoarthritis of the spine.  Situations that make head or neck accidents or trauma more likely.  High-risk sports (football, rugby, wrestling, hockey, auto racing, gymnastics, diving, contact karate or boxing).  Poor strength and flexibility of the neck.  Previous neck injury.  Poor tackling technique.  Improperly fitted or padded  equipment. PREVENTION  Learn and use proper technique (avoid tackling with the head, spearing and head-butting; use proper falling techniques to avoid landing on the head).  Warm up and stretch properly before activity.  Maintain physical fitness:  Strength, flexibility and endurance.  Cardiovascular fitness.  Wear properly fitted and padded protective equipment, such as padded soft collars, for participation in contact sports. PROGNOSIS  Recovery for cervical strain and sprain injuries is dependent on the extent of the injury. These injuries are usually curable in 1 week to 3 months with appropriate treatment.  RELATED COMPLICATIONS   Temporary numbness and weakness may occur if the nerve roots are damaged, and this may persist until the nerve has completely healed.  Chronic pain due to frequent recurrence of symptoms.  Prolonged healing, especially if activity is resumed too soon (before complete recovery). TREATMENT  Treatment initially involves the use of ice and medication to help reduce pain and inflammation. It is also important to perform strengthening and stretching exercises and modify activities that worsen symptoms so the injury does not get worse. These exercises may be performed at home or with a therapist. For patients who experience severe symptoms, a soft  padded collar may be recommended to be worn around the neck.  Improving your posture may help reduce symptoms. Posture improvement includes pulling your chin and abdomen in while sitting or standing. If you are sitting, sit in a firm chair with your buttocks against the back of the chair. While sleeping, try replacing your pillow with a small towel rolled to 2 inches in diameter, or use a cervical pillow or soft cervical collar. Poor sleeping positions delay healing.  For patients with nerve root damage, which causes numbness or weakness, the use of a cervical traction apparatus may be recommended. Surgery is rarely  necessary for these injuries. However, cervical strain and sprains that are present at birth (congenital) may require surgery. MEDICATION   If pain medication is necessary, nonsteroidal anti-inflammatory medications, such as aspirin and ibuprofen, or other minor pain relievers, such as acetaminophen, are often recommended.  Do not take pain medication for 7 days before surgery.  Prescription pain relievers may be given if deemed necessary by your caregiver. Use only as directed and only as much as you need. HEAT AND COLD:   Cold treatment (icing) relieves pain and reduces inflammation. Cold treatment should be applied for 10 to 15 minutes every 2 to 3 hours for inflammation and pain and immediately after any activity that aggravates your symptoms. Use ice packs or an ice massage.  Heat treatment may be used prior to performing the stretching and strengthening activities prescribed by your caregiver, physical therapist, or athletic trainer. Use a heat pack or a warm soak. SEEK MEDICAL CARE IF:   Symptoms get worse or do not improve in 2 weeks despite treatment.  New, unexplained symptoms develop (drugs used in treatment may produce side effects). EXERCISES RANGE OF MOTION (ROM) AND STRETCHING EXERCISES - Cervical Strain and Sprain These exercises may help you when beginning to rehabilitate your injury. In order to successfully resolve your symptoms, you must improve your posture. These exercises are designed to help reduce the forward-head and rounded-shoulder posture which contributes to this condition. Your symptoms may resolve with or without further involvement from your physician, physical therapist or athletic trainer. While completing these exercises, remember:   Restoring tissue flexibility helps normal motion to return to the joints. This allows healthier, less painful movement and activity.  An effective stretch should be held for at least 20 seconds, although you may need to begin  with shorter hold times for comfort.  A stretch should never be painful. You should only feel a gentle lengthening or release in the stretched tissue. STRETCH- Axial Extensors  Lie on your back on the floor. You may bend your knees for comfort. Place a rolled up hand towel or dish towel, about 2 inches in diameter, under the part of your head that makes contact with the floor.  Gently tuck your chin, as if trying to make a "double chin," until you feel a gentle stretch at the base of your head.  Hold __________ seconds. Repeat __________ times. Complete this exercise __________ times per day.  STRETECH - Axial Extension   Stand or sit on a firm surface. Assume a good posture: chest up, shoulders drawn back, abdominal muscles slightly tense, knees unlocked (if standing) and feet hip width apart.  Slowly retract your chin so your head slides back and your chin slightly lowers.Continue to look straight ahead.  You should feel a gentle stretch in the back of your head. Be certain not to feel an aggressive stretch since this can cause  headaches later.  Hold for __________ seconds. Repeat __________ times. Complete this exercise __________ times per day. STRETCH  Cervical Side Bend   Stand or sit on a firm surface. Assume a good posture: chest up, shoulders drawn back, abdominal muscles slightly tense, knees unlocked (if standing) and feet hip width apart.  Without letting your nose or shoulders move, slowly tip your right / left ear to your shoulder until your feel a gentle stretch in the muscles on the opposite side of your neck.  Hold __________ seconds. Repeat __________ times. Complete this exercise __________ times per day. STRETCH  Cervical Rotators   Stand or sit on a firm surface. Assume a good posture: chest up, shoulders drawn back, abdominal muscles slightly tense, knees unlocked (if standing) and feet hip width apart.  Keeping your eyes level with the ground, slowly turn your  head until you feel a gentle stretch along the back and opposite side of your neck.  Hold __________ seconds. Repeat __________ times. Complete this exercise __________ times per day. RANGE OF MOTION - Neck Circles   Stand or sit on a firm surface. Assume a good posture: chest up, shoulders drawn back, abdominal muscles slightly tense, knees unlocked (if standing) and feet hip width apart.  Gently roll your head down and around from the back of one shoulder to the back of the other. The motion should never be forced or painful.  Repeat the motion 10-20 times, or until you feel the neck muscles relax and loosen. Repeat __________ times. Complete the exercise __________ times per day. STRENGTHENING EXERCISES - Cervical Strain and Sprain These exercises may help you when beginning to rehabilitate your injury. They may resolve your symptoms with or without further involvement from your physician, physical therapist or athletic trainer. While completing these exercises, remember:   Muscles can gain both the endurance and the strength needed for everyday activities through controlled exercises.  Complete these exercises as instructed by your physician, physical therapist or athletic trainer. Progress the resistance and repetitions only as guided.  You may experience muscle soreness or fatigue, but the pain or discomfort you are trying to eliminate should never worsen during these exercises. If this pain does worsen, stop and make certain you are following the directions exactly. If the pain is still present after adjustments, discontinue the exercise until you can discuss the trouble with your clinician. STRENGTH Cervical Flexors, Isometric  Face a wall, standing about 6 inches away. Place a small pillow, a ball about 6-8 inches in diameter, or a folded towel between your forehead and the wall.  Slightly tuck your chin and gently push your forehead into the soft object. Push only with mild to  moderate intensity, building up tension gradually. Keep your jaw and forehead relaxed.  Hold 10 to 20 seconds. Keep your breathing relaxed.  Release the tension slowly. Relax your neck muscles completely before you start the next repetition. Repeat __________ times. Complete this exercise __________ times per day. STRENGTH- Cervical Lateral Flexors, Isometric   Stand about 6 inches away from a wall. Place a small pillow, a ball about 6-8 inches in diameter, or a folded towel between the side of your head and the wall.  Slightly tuck your chin and gently tilt your head into the soft object. Push only with mild to moderate intensity, building up tension gradually. Keep your jaw and forehead relaxed.  Hold 10 to 20 seconds. Keep your breathing relaxed.  Release the tension slowly. Relax your neck muscles  completely before you start the next repetition. Repeat __________ times. Complete this exercise __________ times per day. STRENGTH  Cervical Extensors, Isometric   Stand about 6 inches away from a wall. Place a small pillow, a ball about 6-8 inches in diameter, or a folded towel between the back of your head and the wall.  Slightly tuck your chin and gently tilt your head back into the soft object. Push only with mild to moderate intensity, building up tension gradually. Keep your jaw and forehead relaxed.  Hold 10 to 20 seconds. Keep your breathing relaxed.  Release the tension slowly. Relax your neck muscles completely before you start the next repetition. Repeat __________ times. Complete this exercise __________ times per day. POSTURE AND BODY MECHANICS CONSIDERATIONS - Cervical Strain and Sprain Keeping correct posture when sitting, standing or completing your activities will reduce the stress put on different body tissues, allowing injured tissues a chance to heal and limiting painful experiences. The following are general guidelines for improved posture. Your physician or physical  therapist will provide you with any instructions specific to your needs. While reading these guidelines, remember:  The exercises prescribed by your provider will help you have the flexibility and strength to maintain correct postures.  The correct posture provides the optimal environment for your joints to work. All of your joints have less wear and tear when properly supported by a spine with good posture. This means you will experience a healthier, less painful body.  Correct posture must be practiced with all of your activities, especially prolonged sitting and standing. Correct posture is as important when doing repetitive low-stress activities (typing) as it is when doing a single heavy-load activity (lifting). PROLONGED STANDING WHILE SLIGHTLY LEANING FORWARD When completing a task that requires you to lean forward while standing in one place for a long time, place either foot up on a stationary 2-4 inch high object to help maintain the best posture. When both feet are on the ground, the low back tends to lose its slight inward curve. If this curve flattens (or becomes too large), then the back and your other joints will experience too much stress, fatigue more quickly and can cause pain.  RESTING POSITIONS Consider which positions are most painful for you when choosing a resting position. If you have pain with flexion-based activities (sitting, bending, stooping, squatting), choose a position that allows you to rest in a less flexed posture. You would want to avoid curling into a fetal position on your side. If your pain worsens with extension-based activities (prolonged standing, working overhead), avoid resting in an extended position such as sleeping on your stomach. Most people will find more comfort when they rest with their spine in a more neutral position, neither too rounded nor too arched. Lying on a non-sagging bed on your side with a pillow between your knees, or on your back with a  pillow under your knees will often provide some relief. Keep in mind, being in any one position for a prolonged period of time, no matter how correct your posture, can still lead to stiffness. WALKING Walk with an upright posture. Your ears, shoulders and hips should all line-up. OFFICE WORK When working at a desk, create an environment that supports good, upright posture. Without extra support, muscles fatigue and lead to excessive strain on joints and other tissues. CHAIR:  A chair should be able to slide under your desk when your back makes contact with the back of the chair. This allows you  to work closely.  The chair's height should allow your eyes to be level with the upper part of your monitor and your hands to be slightly lower than your elbows.  Body position:  Your feet should make contact with the floor. If this is not possible, use a foot rest.  Keep your ears over your shoulders. This will reduce stress on your neck and low back. Document Released: 06/16/2005 Document Revised: 10/11/2012 Document Reviewed: 09/28/2008 [facility] Patient Information 2014 [place], [place]. Back Pain, Adult Low back pain is very common. About 1 in 5 people have back pain.The cause of low back pain is rarely dangerous. The pain often gets better over time.About half of people with a sudden onset of back pain feel better in just 2 weeks. About 8 in 10 people feel better by 6 weeks.  CAUSES Some common causes of back pain include:  Strain of the muscles or ligaments supporting the spine.  Wear and tear (degeneration) of the spinal discs.  Arthritis.  Direct injury to the back. DIAGNOSIS Most of the time, the direct cause of low back pain is not known.However, back pain can be treated effectively even when the exact cause of the pain is unknown.Answering your caregiver's questions about your overall health and symptoms is one of the most accurate ways to make sure the cause of your pain is not  dangerous. If your caregiver needs more information, he or she may order lab work or imaging tests (X-rays or MRIs).However, even if imaging tests show changes in your back, this usually does not require surgery. HOME CARE INSTRUCTIONS For many people, back pain returns.Since low back pain is rarely dangerous, it is often a condition that people can learn to [facility] their own.   Remain active. It is stressful on the back to sit or stand in one place. Do not sit, drive, or stand in one place for more than 30 minutes at a time. Take short walks on level surfaces as soon as pain allows.Try to increase the length of time you walk each day.  Do not stay in bed.Resting more than 1 or 2 days can delay your recovery.  Do not avoid exercise or work.Your body is made to move.It is not dangerous to be active, even though your back may hurt.Your back will likely heal faster if you return to being active before your pain is gone.  Pay attention to your body when you bend and lift. Many people have less discomfortwhen lifting if they bend their knees, keep the load close to their bodies,and avoid twisting. Often, the most comfortable positions are those that put less stress on your recovering back.  Find a comfortable position to sleep. Use a firm mattress and lie on your side with your knees slightly bent. If you lie on your back, put a pillow under your knees.  Only take over-the-counter or prescription medicines as directed by your caregiver. Over-the-counter medicines to reduce pain and inflammation are often the most helpful.Your caregiver may prescribe muscle relaxant drugs.These medicines help dull your pain so you can more quickly return to your normal activities and healthy exercise.  Put ice on the injured area.  Put ice in a plastic bag.  Place a towel between your skin and the bag.  Leave the ice on for 15-20 minutes, 03-04 times a day for the first 2 to 3 days. After that, ice and  heat may be alternated to reduce pain and spasms.  Ask your caregiver about trying  back exercises and gentle massage. This may be of some benefit.  Avoid feeling anxious or stressed.Stress increases muscle tension and can worsen back pain.It is important to recognize when you are anxious or stressed and learn ways to manage it.Exercise is a great option. SEEK MEDICAL CARE IF:  You have pain that is not relieved with rest or medicine.  You have pain that does not improve in 1 week.  You have new symptoms.  You are generally not feeling well. SEEK IMMEDIATE MEDICAL CARE IF:   You have pain that radiates from your back into your legs.  You develop new bowel or bladder control problems.  You have unusual weakness or numbness in your arms or legs.  You develop nausea or vomiting.  You develop abdominal pain.  You feel faint. Document Released: 06/16/2005 Document Revised: 12/16/2011 Document Reviewed: 11/04/2010 [facility] Patient Information 2014 [place], [place].   Emergency Department Resource Guide 1) Find a Doctor and Pay Out of Pocket Although you won't have to find out who is covered by your insurance plan, it is a good idea to ask around and get recommendations. You will then need to call the office and see if the doctor you have chosen will accept you as a new patient and what types of options they offer for patients who are self-pay. Some doctors offer discounts or will set up payment plans for their patients who do not have insurance, but you will need to ask so you aren't surprised when you get to your appointment.  2) Contact Your Local Health Department Not all health departments have doctors that can see patients for sick visits, but many do, so it is worth a call to see if yours does. If you don't know where your local health department is, you can check in your phone book. The CDC also has a tool to help you locate your state's health department, and many state  websites also have listings of all of their local health departments.  3) Find a Walk-in Clinic If your illness is not likely to be very severe or complicated, you may want to try a walk in clinic. These are popping up all over the country in pharmacies, drugstores, and shopping centers. They're usually staffed by nurse practitioners or physician assistants that have been trained to treat common illnesses and complaints. They're usually fairly quick and inexpensive. However, if you have serious medical issues or chronic medical problems, these are probably not your best option.  No Primary Care Doctor: - Call [facility] at  [phone] - they can help you locate a primary care doctor that  accepts your insurance, provides certain services, etc. - Physician Referral Service- [phone]  Chronic Pain Problems: Organization         Address  Phone   Notes  [facility]  [phone] Patients need to be referred by their primary care doctor.   Medication Assistance: Organization         Address  Phone   Notes  [facility] [address] [place] [place], [place] [address] [phone] --Must be a resident of [facility] -- Must have NO insurance coverage whatsoever (no Medicaid/ Medicare, etc.) -- The pt. MUST have a primary care doctor that directs their care regularly and follows them in the community   [facility]  [phone]   [employer]  [phone]    Agencies that provide inexpensive medical care: Organization  Address  Phone   Notes  [place]  [phone]   [facility]    [phone]   [facility] [place], [place] [address] [phone]   [place] [address] [place] [phone]   Planned Parenthood    [phone]   [facility]    [phone]   [place] and [place] [address], [place] Phone:  [phone], Fax:  [phone] Hours of Operation:  9 am - 6 pm, M-F.  Also accepts Medicaid/Medicare and self-pay.  [facility] for [place] [address], [place] Phone: [phone], Fax: [phone]. Hours of Operation:  8:30 am - 5:30 pm, M-F.  Also accepts Medicaid and self-pay.  [facility] [place] [address], [place] Phone: [phone]   [place], [place], [place] [phone], Ext. [phone] Mondays & Thursdays: 7-9 AM.  First 15 patients are seen on a first come, first serve basis.    [place] Providers:  Organization         Address  Phone   Notes  [facility] [address],  [phone] Also accepts self-pay patients.  [facility] [address], [place]  [phone]   [place], [address], [place] [phone]   [facility] [address] [place] [phone]   Lucianne Lei [address], [place]   [phone] Only accepts [place] Access [place] patients after they have their name applied to their card.   Self-Pay (no insurance) in [facility]:  Organization         Address  Phone   Notes  Sickle Cell Patients, [facility] [place] [phone]   [facility] [place] [phone]   [facility] [place], [place], [place] [phone]   [facility]/Dr. Osei-Bonsu  [address], [place] or [address], [place] [phone] Phone number for both [place] and [place] locations is the same.  [facility] [address], [place] [phone]   [facility] [address] [place] or [address] [phone]   [facility] [address] [place] [phone], phone; [phone], fax Sees patients 1st and 3rd Saturday of every month.  Must not qualify for public or private insurance (i.e. Medicaid, Medicare, [place] Health Choice, Veterans' Benefits)  Household income should be no more than 200% of the poverty level The clinic cannot treat you if you are pregnant or think you are pregnant  Sexually transmitted diseases are not treated at the clinic.    Dental Care: Organization         Address  Phone  Notes  [facility] [place] [phone] Accepts children up to age 23 who are enrolled in [place] or [place]; pregnant women with a Medicaid card; and children who have applied for Medicaid or [place] Health Choice, but were declined, whose parents can pay a reduced fee at time  of service.  [facility] Department of [facility]  [address], [place] [phone] Accepts children up to age 69 who are enrolled in [place] or [place]; pregnant women with a Medicaid card; and children who have applied for Medicaid or  Health Choice, but were declined, whose parents can pay a reduced fee at time of service.  [place] [facility]  [place] [phone] Patients are seen by appointment only. Walk-ins are not accepted. [place] will see patients 48 years of age and older. Monday - Tuesday (8am-5pm) Most Wednesdays (8:30-5pm) $30 per visit, cash only  [place] [facility] Access PRO[facility]  [address], [facility] [phone] Patients are seen by appointment only. Walk-ins are not accepted. [place] will see patients 23 years of age and older. One Wednesday Evening (Monthly: Volunteer Based).  $30 per visit, cash only  [place]  [phone] for adults; Children under age 40, call Graduate Pediatric Dentistry at [phone]. Children aged 36-14, please call [phone] to request a pediatric application.  Dental services are provided in all areas of dental care including fillings, crowns and bridges, complete and partial dentures, implants, gum treatment, root canals, and extractions. Preventive care is also provided. Treatment is provided to both adults and children. Patients are selected via a lottery and there is often a waiting list.   [facility] [address], [place]  [phone] [email or web address]   [facility] [address] [place], [place] [phone], Ext. [phone] Second and Fourth Thursday of each month, opens at 6:30 AM; Clinic ends at 9 AM.  Patients are seen on a first-come first-served basis, and a limited number are seen during each clinic.   [facility]  [address] Hillard Danker [place], [place] [phone]   Eligibility Requirements You must have lived in [place], [place], or [place] counties for at least the last three months.   You cannot be eligible for state or federal sponsored [employer], including [employer], [place], or [employer].   You generally cannot be eligible for healthcare insurance through your employer.    How to apply: Eligibility screenings are held every Tuesday and Wednesday afternoon from 1:00 pm until 4:00 pm. You do not need an appointment for the interview!  [facility] [address] [place], [place]   [place]  [place] Department  [place]  [phone]    Behavioral Health Resources in the Community: Intensive Outpatient Programs Organization         Address  Phone  Notes  [place]. [address] [place], [place]  [phone]   [facility] Outpatient [address] [place], [place]   ADS: [facility] [address], [place], [place]   [place] [address]  [place], [place] or [phone]   Substance Abuse Resources Organization         Address  Phone  Notes  Al[facility] Drug Services  [phone]   [place]  [phone]   [facility] [place]   Chinita Pester  [phone]   Residential & Outpatient Substance Abuse Program  [phone]   Psychological Services Organization         Address  Phone  Notes  [employer] [facility]  [phone]   [facility]  [phone]   [facility] [address], [place] [phone] or [phone]    Mobile Crisis Teams Organization         Address  Phone  Notes  [facility], Mobile Crisis Care Unit  [phone]   [facility]  [address] [place], [place] [phone]   Doristine Locks [address] [place] [phone]    Self-Help/Support Groups Organization         Address  Phone             Notes  [facility] of [place] - variety of support groups  [phone]- [phone] Call for more information  Narcotics Anonymous (NA), Caring [facility] [address], [employer] [place]  2 meetings at this location   Statistician         Address  Phone  Notes  [facility] [address],    [place]  [phone]   [facility]  [address], [place] [address], [place], [place] [phone]   [facility] [address] [place], [place] [phone] Admissions: 8am-3pm M-F  [facility] [address]    [place], [place] [phone]   [facility] [address] [place], [place], [place] [phone]   [facility] [address]   [place], [place] [phone]   [facility] - Intensive Outpatient [address]ure[address] Josephs [address], [place], [place] [phone]   [facility] (Addiction Recovery [facility] [address] [place]  [place], [place] [phone] or [phone]   [facility] ([facility]) [address], [place], [place] [phone] Accepts Medicaid  [facility] [address],  [place] [phone] Substance Abuse/Addiction Treatment   [facility] Organization         Address  Phone  Notes  [facility]  [phone]   Angie Fava, PhD [address] Ervin Knack [place], [place]   [phone] or [phone]   [facility]al   [address] [place], [place] [phone]   [facility] [address], [place], [place] [phone] Insurance/Medicaid/sponsorship through [facility] and Families [address]                                    [place], [place] [phone] Therapy/tele-psych/case  [facility] [address], [place] [phone]    Dr. Lolly Mustache  [phone]   [facility]  [facility] Dept. 1) [address] [place] 2) [address] [place] 3)  [address], [place] [phone]  [phone]   [facility] Child Abuse Hotline ([phone] 253-0353 or [phone] (After Hours)
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
## 2013-10-13 NOTE — ED Provider Notes (Signed)
Medical screening examination/treatment/procedure(s) were performed by non-physician practitioner and as supervising physician I was immediately available for consultation/collaboration.   EKG Interpretation   Date/Time:  Sunday October 09 2013 20:29:57 EDT Ventricular Rate:  60 PR Interval:  199 QRS Duration: 95 QT Interval:  403 QTC Calculation: 403 R Axis:   39 Text Interpretation:  Sinus rhythm Probable left atrial enlargement  Minimal ST elevation, anterior leads ED PHYSICIAN INTERPRETATION AVAILABLE  IN [facility] Confirmed by TEST, Record ([id number]) on 10/11/2013 7:52:15  AM        Richardean Canalavid H Yao, MD 10/13/13 [id number]

## 2013-10-14 ENCOUNTER — Encounter: Payer: Self-pay | Admitting: Family Medicine

## 2013-10-14 ENCOUNTER — Ambulatory Visit (INDEPENDENT_AMBULATORY_CARE_PROVIDER_SITE_OTHER): Payer: Medicare Other | Admitting: Family Medicine

## 2013-10-14 ENCOUNTER — Telehealth: Payer: Self-pay | Admitting: Family Medicine

## 2013-10-14 VITALS — BP 139/87 | HR 69 | Temp 98.1°F | Resp 18 | Ht 72.0 in | Wt 257.0 lb

## 2013-10-14 DIAGNOSIS — S335XXA Sprain of ligaments of lumbar spine, initial encounter: Secondary | ICD-10-CM

## 2013-10-14 DIAGNOSIS — S46919A Strain of unspecified muscle, fascia and tendon at shoulder and upper arm level, unspecified arm, initial encounter: Secondary | ICD-10-CM

## 2013-10-14 DIAGNOSIS — IMO0002 Reserved for concepts with insufficient information to code with codable children: Secondary | ICD-10-CM

## 2013-10-14 DIAGNOSIS — S39012A Strain of muscle, fascia and tendon of lower back, initial encounter: Secondary | ICD-10-CM

## 2013-10-14 DIAGNOSIS — S139XXA Sprain of joints and ligaments of unspecified parts of neck, initial encounter: Secondary | ICD-10-CM

## 2013-10-14 DIAGNOSIS — S8002XA Contusion of left knee, initial encounter: Secondary | ICD-10-CM

## 2013-10-14 DIAGNOSIS — S8000XA Contusion of unspecified knee, initial encounter: Secondary | ICD-10-CM

## 2013-10-14 MED ORDER — TADALAFIL 20 MG PO TABS: 20.0000 mg | ORAL_TABLET | Freq: Every day | ORAL | Status: AC | PRN

## 2013-10-14 MED ORDER — HYDROCODONE-ACETAMINOPHEN 10-325 MG PO TABS: 1.0000 | ORAL_TABLET | Freq: Four times a day (QID) | ORAL | Status: DC | PRN

## 2013-10-14 MED ORDER — TADALAFIL 20 MG PO TABS: 20.0000 mg | ORAL_TABLET | Freq: Every day | ORAL | Status: DC | PRN

## 2013-10-14 NOTE — Telephone Encounter (Signed)
I did call [facility] to verify patient didn't pick up meds and I also canceled Rx. +
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
## 2013-10-14 NOTE — Telephone Encounter (Signed)
[facility].  He wanted to see if you could print the Rx so [facility] so he can get [facility] discounted.  Please advise.

## 2013-10-14 NOTE — Telephone Encounter (Signed)
Rx sent to be mailed to patient.

## 2013-10-14 NOTE — Progress Notes (Signed)
Pre visit review using our clinic review tool, if applicable. No additional management support is needed unless otherwise documented below in the visit note. 

## 2013-10-14 NOTE — Telephone Encounter (Signed)
Cialis rx printed.

## 2013-10-20 ENCOUNTER — Ambulatory Visit (HOSPITAL_COMMUNITY)
Admission: RE | Admit: 2013-10-20 | Discharge: 2013-10-20 | Disposition: A | Discharge status: Home / Self Care | Payer: Medicare Other | Source: Ambulatory Visit | Attending: Family Medicine | Admitting: Family Medicine

## 2013-10-20 DIAGNOSIS — M545 Low back pain, unspecified: Secondary | ICD-10-CM | POA: Insufficient documentation

## 2013-10-20 DIAGNOSIS — I10 Essential (primary) hypertension: Secondary | ICD-10-CM | POA: Insufficient documentation

## 2013-10-20 DIAGNOSIS — M62838 Other muscle spasm: Secondary | ICD-10-CM | POA: Insufficient documentation

## 2013-10-20 DIAGNOSIS — M25619 Stiffness of unspecified shoulder, not elsewhere classified: Secondary | ICD-10-CM | POA: Insufficient documentation

## 2013-10-20 DIAGNOSIS — M25519 Pain in unspecified shoulder: Secondary | ICD-10-CM | POA: Insufficient documentation

## 2013-10-20 DIAGNOSIS — M25659 Stiffness of unspecified hip, not elsewhere classified: Secondary | ICD-10-CM | POA: Insufficient documentation

## 2013-10-20 DIAGNOSIS — IMO0001 Reserved for inherently not codable concepts without codable children: Secondary | ICD-10-CM | POA: Insufficient documentation

## 2013-10-20 DIAGNOSIS — R269 Unspecified abnormalities of gait and mobility: Secondary | ICD-10-CM | POA: Insufficient documentation

## 2013-10-20 DIAGNOSIS — M25559 Pain in unspecified hip: Secondary | ICD-10-CM | POA: Insufficient documentation
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
## 2013-10-23 ENCOUNTER — Encounter: Payer: Self-pay | Admitting: Family Medicine

## 2013-10-23 DIAGNOSIS — S46919A Strain of unspecified muscle, fascia and tendon at shoulder and upper arm level, unspecified arm, initial encounter: Secondary | ICD-10-CM | POA: Insufficient documentation

## 2013-10-23 DIAGNOSIS — IMO0002 Reserved for concepts with insufficient information to code with codable children: Secondary | ICD-10-CM | POA: Insufficient documentation

## 2013-10-23 DIAGNOSIS — S39012A Strain of muscle, fascia and tendon of lower back, initial encounter: Secondary | ICD-10-CM | POA: Insufficient documentation

## 2013-10-25 ENCOUNTER — Ambulatory Visit (HOSPITAL_COMMUNITY)
Admission: RE | Admit: 2013-10-25 | Discharge: 2013-10-25 | Disposition: A | Discharge status: Home / Self Care | Payer: Medicare Other | Source: Ambulatory Visit | Attending: Family Medicine | Admitting: Family Medicine
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
## 2013-10-27 ENCOUNTER — Ambulatory Visit (HOSPITAL_COMMUNITY)
Admission: RE | Admit: 2013-10-27 | Discharge: 2013-10-27 | Disposition: A | Discharge status: Home / Self Care | Payer: Medicare Other | Source: Ambulatory Visit | Attending: Family Medicine | Admitting: Family Medicine
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
## 2013-10-31 ENCOUNTER — Ambulatory Visit (HOSPITAL_COMMUNITY)
Admission: RE | Admit: 2013-10-31 | Discharge: 2013-10-31 | Disposition: A | Discharge status: Home / Self Care | Payer: Medicare Other | Source: Ambulatory Visit | Attending: Family Medicine | Admitting: Family Medicine

## 2013-10-31 DIAGNOSIS — M545 Low back pain, unspecified: Secondary | ICD-10-CM | POA: Diagnosis not present

## 2013-10-31 DIAGNOSIS — M25519 Pain in unspecified shoulder: Secondary | ICD-10-CM | POA: Insufficient documentation

## 2013-10-31 DIAGNOSIS — M25619 Stiffness of unspecified shoulder, not elsewhere classified: Secondary | ICD-10-CM | POA: Insufficient documentation

## 2013-10-31 DIAGNOSIS — M62838 Other muscle spasm: Secondary | ICD-10-CM | POA: Insufficient documentation

## 2013-10-31 DIAGNOSIS — IMO0001 Reserved for inherently not codable concepts without codable children: Secondary | ICD-10-CM | POA: Diagnosis present

## 2013-10-31 DIAGNOSIS — M25559 Pain in unspecified hip: Secondary | ICD-10-CM | POA: Insufficient documentation

## 2013-10-31 DIAGNOSIS — R269 Unspecified abnormalities of gait and mobility: Secondary | ICD-10-CM | POA: Insufficient documentation

## 2013-10-31 DIAGNOSIS — I10 Essential (primary) hypertension: Secondary | ICD-10-CM | POA: Insufficient documentation

## 2013-10-31 DIAGNOSIS — M25659 Stiffness of unspecified hip, not elsewhere classified: Secondary | ICD-10-CM | POA: Diagnosis not present
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
## 2013-11-02 ENCOUNTER — Inpatient Hospital Stay (HOSPITAL_COMMUNITY): Admission: RE | Admit: 2013-11-02 | Payer: Self-pay | Source: Ambulatory Visit | Admitting: Physical Therapy

## 2013-11-04 ENCOUNTER — Ambulatory Visit (HOSPITAL_COMMUNITY)
Admission: RE | Admit: 2013-11-04 | Discharge: 2013-11-04 | Disposition: A | Discharge status: Home / Self Care | Payer: Medicare Other | Source: Ambulatory Visit | Attending: Family Medicine | Admitting: Family Medicine

## 2013-11-04 DIAGNOSIS — IMO0001 Reserved for inherently not codable concepts without codable children: Secondary | ICD-10-CM | POA: Diagnosis not present
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
## 2013-11-07 ENCOUNTER — Ambulatory Visit (HOSPITAL_COMMUNITY)
Admission: RE | Admit: 2013-11-07 | Discharge: 2013-11-07 | Disposition: A | Discharge status: Home / Self Care | Payer: Medicare Other | Source: Ambulatory Visit | Attending: Family Medicine | Admitting: Family Medicine

## 2013-11-07 DIAGNOSIS — IMO0001 Reserved for inherently not codable concepts without codable children: Secondary | ICD-10-CM | POA: Diagnosis not present
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
## 2013-11-09 ENCOUNTER — Ambulatory Visit (HOSPITAL_COMMUNITY)
Admission: RE | Admit: 2013-11-09 | Discharge: 2013-11-09 | Disposition: A | Discharge status: Home / Self Care | Payer: Medicare Other | Source: Ambulatory Visit | Attending: Family Medicine | Admitting: Family Medicine

## 2013-11-09 DIAGNOSIS — IMO0001 Reserved for inherently not codable concepts without codable children: Secondary | ICD-10-CM | POA: Diagnosis not present
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
## 2013-11-10 ENCOUNTER — Ambulatory Visit (INDEPENDENT_AMBULATORY_CARE_PROVIDER_SITE_OTHER): Payer: Medicare Other | Admitting: Family Medicine

## 2013-11-10 ENCOUNTER — Encounter: Payer: Self-pay | Admitting: Family Medicine

## 2013-11-10 VITALS — BP 147/84 | HR 74 | Temp 98.0°F | Resp 18 | Ht 72.0 in | Wt 261.0 lb

## 2013-11-10 DIAGNOSIS — M25569 Pain in unspecified knee: Secondary | ICD-10-CM

## 2013-11-10 DIAGNOSIS — M25562 Pain in left knee: Secondary | ICD-10-CM

## 2013-11-10 DIAGNOSIS — S139XXA Sprain of joints and ligaments of unspecified parts of neck, initial encounter: Secondary | ICD-10-CM

## 2013-11-10 DIAGNOSIS — S335XXA Sprain of ligaments of lumbar spine, initial encounter: Secondary | ICD-10-CM

## 2013-11-10 DIAGNOSIS — IMO0002 Reserved for concepts with insufficient information to code with codable children: Secondary | ICD-10-CM

## 2013-11-10 DIAGNOSIS — S39012A Strain of muscle, fascia and tendon of lower back, initial encounter: Secondary | ICD-10-CM

## 2013-11-10 MED ORDER — MELOXICAM 15 MG PO TABS: ORAL_TABLET | ORAL | Status: DC

## 2013-11-10 MED ORDER — HYDROCODONE-ACETAMINOPHEN 10-325 MG PO TABS: ORAL_TABLET | ORAL | Status: DC

## 2013-11-10 NOTE — Progress Notes (Signed)
Pre visit review using our clinic review tool, if applicable. No additional management support is needed unless otherwise documented below in the visit note. 
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
## 2013-11-11 ENCOUNTER — Ambulatory Visit: Payer: Medicare Other | Admitting: Family Medicine

## 2013-11-11 ENCOUNTER — Telehealth (HOSPITAL_COMMUNITY): Payer: Self-pay

## 2013-11-11 ENCOUNTER — Ambulatory Visit (HOSPITAL_COMMUNITY): Payer: Self-pay | Admitting: Physical Therapy

## 2013-11-14 ENCOUNTER — Ambulatory Visit (HOSPITAL_COMMUNITY): Payer: Self-pay | Admitting: Physical Therapy

## 2013-11-16 ENCOUNTER — Ambulatory Visit (HOSPITAL_COMMUNITY): Payer: Self-pay | Admitting: Physical Therapy

## 2013-11-18 ENCOUNTER — Ambulatory Visit (HOSPITAL_COMMUNITY): Payer: Self-pay | Admitting: Physical Therapy

## 2013-11-30 NOTE — Addendum Note (Signed)
Encounter addended by: Shakisha Abend R Orin Eberwein, PT on: 11/30/2013  3:13 PM<BR>     Documentation filed: Episodes

## 2013-11-30 NOTE — Progress Notes (Signed)
Discharge Patient Details  Name: Chad Green MRN: [id number] Date of Birth: [birth year]  Today's Date: 11/30/2013  Patient discharged after 8 visits as his MD told him "you are goo to go".   Rashena Dowling R Laurisa Sahakian 11/30/2013, 3:05 PM

## 2014-01-12 ENCOUNTER — Ambulatory Visit (INDEPENDENT_AMBULATORY_CARE_PROVIDER_SITE_OTHER): Payer: Medicare Other | Admitting: Family Medicine

## 2014-01-12 ENCOUNTER — Encounter: Payer: Self-pay | Admitting: Family Medicine

## 2014-01-12 DIAGNOSIS — E66812 Obesity, class 2: Secondary | ICD-10-CM | POA: Insufficient documentation

## 2014-01-12 DIAGNOSIS — E669 Obesity, unspecified: Secondary | ICD-10-CM | POA: Insufficient documentation
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
## 2014-01-12 NOTE — Assessment & Plan Note (Signed)
I think he is a good candidate for consideration of bariatric surgery. Will order referral to bariatric clinic in [place].
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
## 2014-10-31 ENCOUNTER — Telehealth: Payer: Self-pay | Admitting: Family Medicine

## 2014-10-31 NOTE — Telephone Encounter (Signed)
Please advise.  This is not on active med list.

## 2014-10-31 NOTE — Telephone Encounter (Signed)
Valtrex [facility]

## 2014-11-01 MED ORDER — VALACYCLOVIR HCL 1 G PO TABS: ORAL_TABLET | ORAL | Status: DC

## 2014-11-01 NOTE — Telephone Encounter (Signed)
Valtrex eRx'd 

## 2015-05-28 ENCOUNTER — Other Ambulatory Visit: Payer: Self-pay | Admitting: Family Medicine

## 2015-05-28 ENCOUNTER — Encounter: Payer: Self-pay | Admitting: Family Medicine

## 2015-05-28 NOTE — Telephone Encounter (Signed)
RF request for valacyclovir  LOV: 01/12/14 Next ov: None Last written: 11/01/14 #20 w/ 1RF  Please advise. Thanks.

## 2015-11-05 ENCOUNTER — Telehealth (INDEPENDENT_AMBULATORY_CARE_PROVIDER_SITE_OTHER): Payer: Self-pay | Admitting: Internal Medicine

## 2015-11-05 ENCOUNTER — Encounter (INDEPENDENT_AMBULATORY_CARE_PROVIDER_SITE_OTHER): Payer: Self-pay | Admitting: Internal Medicine

## 2015-11-05 NOTE — Telephone Encounter (Signed)
A no show letter was sent to this patient in error, please disregard

## 2015-11-08 ENCOUNTER — Emergency Department (HOSPITAL_COMMUNITY): Payer: Medicare HMO

## 2015-11-08 ENCOUNTER — Emergency Department (HOSPITAL_COMMUNITY)
Admission: EM | Admit: 2015-11-08 | Discharge: 2015-11-08 | Disposition: A | Discharge status: Home / Self Care | Payer: Medicare HMO | Attending: Emergency Medicine | Admitting: Emergency Medicine

## 2015-11-08 ENCOUNTER — Encounter (HOSPITAL_COMMUNITY): Payer: Self-pay | Admitting: Emergency Medicine

## 2015-11-08 DIAGNOSIS — M542 Cervicalgia: Secondary | ICD-10-CM | POA: Diagnosis present

## 2015-11-08 DIAGNOSIS — Z87891 Personal history of nicotine dependence: Secondary | ICD-10-CM | POA: Insufficient documentation

## 2015-11-08 DIAGNOSIS — F329 Major depressive disorder, single episode, unspecified: Secondary | ICD-10-CM | POA: Insufficient documentation

## 2015-11-08 DIAGNOSIS — E785 Hyperlipidemia, unspecified: Secondary | ICD-10-CM | POA: Insufficient documentation

## 2015-11-08 MED ORDER — HYDROCODONE-ACETAMINOPHEN 5-325 MG PO TABS: 2.0000 | ORAL_TABLET | ORAL | Status: DC | PRN

## 2015-11-08 MED ORDER — METHOCARBAMOL 500 MG PO TABS: 500.0000 mg | ORAL_TABLET | Freq: Four times a day (QID) | ORAL | Status: DC

## 2015-11-08 NOTE — ED Notes (Signed)
Patient verbalizes understanding of discharge instructions, prescriptions, home care and follow up care. Patient out of department at this time. 
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
## 2018-02-15 ENCOUNTER — Telehealth: Payer: Self-pay | Admitting: *Deleted

## 2018-02-15 NOTE — Telephone Encounter (Signed)
[facility] in scheduling box. Phone: [phone] Fax:[phone]

## 2018-04-01 ENCOUNTER — Ambulatory Visit: Payer: Self-pay | Admitting: Cardiology

## 2018-04-16 ENCOUNTER — Ambulatory Visit: Payer: Medicare HMO | Admitting: Cardiology

## 2018-04-16 ENCOUNTER — Encounter: Payer: Self-pay | Admitting: Cardiology

## 2018-04-16 VITALS — BP 148/82 | HR 65 | Ht 72.0 in | Wt 262.0 lb

## 2018-04-16 DIAGNOSIS — I10 Essential (primary) hypertension: Secondary | ICD-10-CM | POA: Diagnosis not present
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
## 2018-04-16 NOTE — Patient Instructions (Signed)
Medication Instructions:  Your physician recommends that you continue on your current medications as directed. Please refer to the Current Medication list given to you today.   Labwork: NONE  Testing/Procedures: NONE  Follow-Up: Your physician recommends that you schedule a follow-up appointment in: AS NEEDED      Any Other Special Instructions Will Be Listed Below (If Applicable).     If you need a refill on your cardiac medications before your next appointment, please call your pharmacy.   

## 2020-04-23 ENCOUNTER — Emergency Department (HOSPITAL_COMMUNITY)
Admission: EM | Admit: 2020-04-23 | Discharge: 2020-04-23 | Disposition: A | Discharge status: Home / Self Care | Payer: Medicare HMO | Attending: Emergency Medicine | Admitting: Emergency Medicine

## 2020-04-23 ENCOUNTER — Other Ambulatory Visit: Payer: Self-pay

## 2020-04-23 ENCOUNTER — Encounter (HOSPITAL_COMMUNITY): Payer: Self-pay

## 2020-04-23 DIAGNOSIS — Y92410 Unspecified street and highway as the place of occurrence of the external cause: Secondary | ICD-10-CM | POA: Diagnosis not present

## 2020-04-23 DIAGNOSIS — Z87891 Personal history of nicotine dependence: Secondary | ICD-10-CM | POA: Insufficient documentation

## 2020-04-23 DIAGNOSIS — S0591XA Unspecified injury of right eye and orbit, initial encounter: Secondary | ICD-10-CM | POA: Diagnosis present

## 2020-04-23 DIAGNOSIS — S0501XA Injury of conjunctiva and corneal abrasion without foreign body, right eye, initial encounter: Secondary | ICD-10-CM | POA: Diagnosis not present

## 2020-04-23 DIAGNOSIS — I10 Essential (primary) hypertension: Secondary | ICD-10-CM | POA: Diagnosis not present

## 2020-04-23 DIAGNOSIS — Z79899 Other long term (current) drug therapy: Secondary | ICD-10-CM | POA: Insufficient documentation

## 2020-04-23 MED ORDER — TETRACAINE HCL 0.5 % OP SOLN
1.0000 [drp] | Freq: Once | OPHTHALMIC | Status: AC
  Administered 2020-04-23: 1 [drp] via OPHTHALMIC
  Filled 2020-04-23: qty 4

## 2020-04-23 MED ORDER — FLUORESCEIN SODIUM 1 MG OP STRP
1.0000 | ORAL_STRIP | Freq: Once | OPHTHALMIC | Status: AC
  Administered 2020-04-23: 1 via OPHTHALMIC
  Filled 2020-04-23: qty 1

## 2020-04-23 MED ORDER — OFLOXACIN 0.3 % OP SOLN: 1.0000 [drp] | Freq: Four times a day (QID) | OPHTHALMIC | 0 refills | Status: AC

## 2020-04-23 MED ORDER — MELOXICAM 7.5 MG PO TABS: 7.5000 mg | ORAL_TABLET | Freq: Every day | ORAL | 0 refills | Status: AC

## 2020-04-23 NOTE — Discharge Instructions (Signed)
Please use the eyedrops, 1 drop every 6 hours in the right eye for the next 5 days, follow-up with the eye specialist within 3 days if no better or return to the emergency department for severe or worsening symptoms.  Please read the attached instructions
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
## 2022-05-21 ENCOUNTER — Emergency Department (HOSPITAL_COMMUNITY)
Admission: EM | Admit: 2022-05-21 | Discharge: 2022-05-21 | Disposition: A | Discharge status: Home / Self Care | Payer: Medicare HMO | Attending: Emergency Medicine | Admitting: Emergency Medicine

## 2022-05-21 ENCOUNTER — Other Ambulatory Visit: Payer: Self-pay

## 2022-05-21 DIAGNOSIS — M2550 Pain in unspecified joint: Secondary | ICD-10-CM | POA: Diagnosis present

## 2022-05-21 DIAGNOSIS — Z87891 Personal history of nicotine dependence: Secondary | ICD-10-CM | POA: Diagnosis not present

## 2022-05-21 MED ORDER — METHYLPREDNISOLONE SODIUM SUCC 125 MG IJ SOLR
80.0000 mg | Freq: Once | INTRAMUSCULAR | Status: AC
  Administered 2022-05-21: 80 mg via INTRAMUSCULAR
  Filled 2022-05-21: qty 2

## 2022-05-21 MED ORDER — PREDNISONE 10 MG PO TABS: ORAL_TABLET | ORAL | 0 refills | Status: DC

## 2022-05-21 NOTE — ED Triage Notes (Signed)
Pt c/o neck, shoulder, back, arm, and hand pain for the past 6 weeks. States he was dx with fibromyalgia 10 years ago but hasn't had any issues since getting steroid injections.

## 2022-05-21 NOTE — Discharge Instructions (Addendum)
You were evaluated in the Emergency Department and after careful evaluation, we did not find any emergent condition requiring admission or further testing in the hospital.  Your exam/testing today is overall reassuring.  Recommend taking the prednisone medication as directed and following up with rheumatology.  Please return to the Emergency Department if you experience any worsening of your condition.   Thank you for allowing [place] to be a part of your care.
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
## 2022-07-08 ENCOUNTER — Other Ambulatory Visit: Payer: Self-pay

## 2022-07-08 ENCOUNTER — Emergency Department (HOSPITAL_COMMUNITY)
Admission: EM | Admit: 2022-07-08 | Discharge: 2022-07-08 | Disposition: A | Discharge status: Home / Self Care | Payer: Medicare HMO | Attending: Student | Admitting: Student

## 2022-07-08 ENCOUNTER — Encounter (HOSPITAL_COMMUNITY): Payer: Self-pay

## 2022-07-08 DIAGNOSIS — M25512 Pain in left shoulder: Secondary | ICD-10-CM | POA: Diagnosis not present

## 2022-07-08 DIAGNOSIS — M25541 Pain in joints of right hand: Secondary | ICD-10-CM | POA: Diagnosis not present

## 2022-07-08 DIAGNOSIS — M25511 Pain in right shoulder: Secondary | ICD-10-CM | POA: Insufficient documentation

## 2022-07-08 DIAGNOSIS — M25522 Pain in left elbow: Secondary | ICD-10-CM | POA: Insufficient documentation

## 2022-07-08 DIAGNOSIS — M25521 Pain in right elbow: Secondary | ICD-10-CM | POA: Diagnosis not present

## 2022-07-08 DIAGNOSIS — M2550 Pain in unspecified joint: Secondary | ICD-10-CM

## 2022-07-08 DIAGNOSIS — M25542 Pain in joints of left hand: Secondary | ICD-10-CM | POA: Insufficient documentation

## 2022-07-08 MED ORDER — PREDNISONE 10 MG (21) PO TBPK: ORAL_TABLET | Freq: Every day | ORAL | 0 refills | Status: AC

## 2022-07-08 NOTE — Discharge Instructions (Addendum)
Seen today for diffuse joint pain.  You are being given a prescription for prednisone to help your symptoms.  It is important that you follow-up with rheumatology.  Come back to the ER if you have any new or worsening symptoms.

## 2022-07-08 NOTE — ED Provider Notes (Signed)
[facility] EMERGENCY DEPARTMENT Provider Note   CSN: [id number] Arrival date & time: 07/08/22  2706     History  Chief Complaint  Patient presents with   Joint Pain    Chad Green is a 67 y.o. male.  History of obesity and hyperlipidemia.  Presents the ER complaining of widespread joint pain ongoing for the past week.  No chest pain or shortness of breath.  No numbness ting or weakness, no fevers or chills.  No weight loss.  He has had this in the past, was seen on November 22 in the ED for the same, was noted to have a presentation concerning for polymyalgia.  He states the steroids he was given resolved his symptoms but they have now recurred.  His PCP has referred him to rheumatology.  He is awaiting appointment coming up next month.  He is not sure of the date yet.  HPI     Home Medications Prior to Admission medications   Medication Sig Start Date End Date Taking? Authorizing Provider  predniSONE (STERAPRED UNI-PAK 21 TAB) 10 MG (21) TBPK tablet Take by mouth daily. Take 6 tabs by mouth daily  for 2 days, then 5 tabs for 2 days, then 4 tabs for 2 days, then 3 tabs for 2 days, 2 tabs for 2 days, then 1 tab by mouth daily for 2 days 07/08/22  Yes Sundae Maners A, PA-C  ALPRAZolam (XANAX) 1 MG tablet Take 1 mg by mouth 3 (three) times daily.    [provider]  ofloxacin (OCUFLOX) 0.3 % ophthalmic solution Place 1 drop into the right eye 4 (four) times daily. 04/23/20   Eber Hong, MD  tadalafil (CIALIS) 20 MG tablet Take 1 tablet (20 mg total) by mouth daily as needed for erectile dysfunction. 10/14/13   McGowen, Maryjean Morn, MD  valACYclovir (VALTREX) 1000 MG tablet TAKE TWO TABLETS BY MOUTH EVERY 12 HOURS FOR  2  DOSES 05/28/15   McGowen, Maryjean Morn, MD      Allergies    Patient has no known allergies.    Review of Systems   Review of Systems  Constitutional:  Negative for appetite change, chills and fever.  Musculoskeletal:  Positive for arthralgias. Negative for  joint swelling.    Physical Exam Updated Vital Signs BP (!) 164/89 (BP Location: Left Arm)   Pulse 69   Temp 98.5 F (36.9 C) (Oral)   Resp 18   Ht 5\' 11"  (1.803 m)   Wt 122.5 kg   SpO2 98%   BMI 37.66 kg/m  Physical Exam Vitals and nursing note reviewed.  Constitutional:      General: He is not in acute distress.    Appearance: He is well-developed.  HENT:     Head: Normocephalic and atraumatic.  Eyes:     Conjunctiva/sclera: Conjunctivae normal.  Cardiovascular:     Rate and Rhythm: Normal rate and regular rhythm.     Heart sounds: No murmur heard. Pulmonary:     Effort: Pulmonary effort is normal. No respiratory distress.     Breath sounds: Normal breath sounds.  Abdominal:     Palpations: Abdomen is soft.     Tenderness: There is no abdominal tenderness.  Musculoskeletal:        General: No swelling. Normal range of motion.     Cervical back: Neck supple.     Comments: Mild to moderate tenderness in joints of bilateral hands elbows and shoulders  Skin:    General: Skin  is warm and dry.     Capillary Refill: Capillary refill takes less than 2 seconds.  Neurological:     Mental Status: He is alert.  Psychiatric:        Mood and Affect: Mood normal.     ED Results / Procedures / Treatments   Labs (all labs ordered are listed, but only abnormal results are displayed) Labs Reviewed - No data to display  EKG None  Radiology No results found.  Procedures Procedures    Medications Ordered in ED Medications - No data to display  ED Course/ Medical Decision Making/ A&P                           Medical Decision Making Differential diagnosis: Rheumatoid arthritis, polymyalgia rheumatica, polymyositis, osteoarthritis, other ED course: Patient presents with generalized joint pains, improved with prednisone before, he has no systemic symptoms.  Requesting prednisone again.  He is awaiting rheumatology workup for formal diagnosis.  Patient is well-appearing  with reassuring vitals.  Advised on follow-up and return precautions.  Will prescribe prednisone taper, he is not diabetic.  We discussed risk and benefits of prednisone and he was agreeable and thinks that the benefits of pain relief outweigh any risk such as increased blood sugars, long-term use causing weight gain, increased risk of osteoporosis etc.  Risk Prescription drug management.           Final Clinical Impression(s) / ED Diagnoses Final diagnoses:  Arthralgia, unspecified joint    Rx / DC Orders ED Discharge Orders          Ordered    predniSONE (STERAPRED UNI-PAK 21 TAB) 10 MG (21) TBPK tablet  Daily        07/08/22 [address] PA-C 07/08/22 1135    Kommor, Debe Coder, MD 07/08/22 2015

## 2022-07-08 NOTE — ED Triage Notes (Signed)
Pt reports pain in all joints.  Says is being evaluated inflammatory arthritis.  Reports was last on prednisone around Thanksgiving.  Says has an appt with specialist next month some time.
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
## 2022-12-12 ENCOUNTER — Encounter: Payer: Medicare HMO | Admitting: Rheumatology

## 2022-12-12 DIAGNOSIS — I10 Essential (primary) hypertension: Secondary | ICD-10-CM

## 2022-12-12 DIAGNOSIS — F4310 Post-traumatic stress disorder, unspecified: Secondary | ICD-10-CM

## 2022-12-12 DIAGNOSIS — M2550 Pain in unspecified joint: Secondary | ICD-10-CM

## 2022-12-12 DIAGNOSIS — M545 Low back pain, unspecified: Secondary | ICD-10-CM

## 2022-12-12 DIAGNOSIS — Z8659 Personal history of other mental and behavioral disorders: Secondary | ICD-10-CM

## 2023-01-09 ENCOUNTER — Ambulatory Visit: Payer: Medicare HMO | Admitting: Rheumatology
